# Patient Record
Sex: Female | Born: 1950 | Race: Black or African American | Hispanic: No | Marital: Single | Smoking: Current every day smoker
Health system: Southern US, Community
[De-identification: ages and names within clinical notes are randomized; demographics above are authoritative.]

## PROBLEM LIST (undated history)

## (undated) DIAGNOSIS — M1990 Unspecified osteoarthritis, unspecified site: Secondary | ICD-10-CM

## (undated) DIAGNOSIS — H269 Unspecified cataract: Secondary | ICD-10-CM

## (undated) DIAGNOSIS — E7800 Pure hypercholesterolemia, unspecified: Secondary | ICD-10-CM

## (undated) DIAGNOSIS — I10 Essential (primary) hypertension: Secondary | ICD-10-CM

## (undated) DIAGNOSIS — G629 Polyneuropathy, unspecified: Secondary | ICD-10-CM

## (undated) DIAGNOSIS — G709 Myoneural disorder, unspecified: Secondary | ICD-10-CM

## (undated) HISTORY — DX: Unspecified osteoarthritis, unspecified site: M19.90

## (undated) HISTORY — DX: Myoneural disorder, unspecified: G70.9

## (undated) HISTORY — DX: Polyneuropathy, unspecified: G62.9

## (undated) HISTORY — PX: BREAST CYST EXCISION: SHX579

## (undated) HISTORY — DX: Unspecified cataract: H26.9

## (undated) HISTORY — PX: ABDOMINAL HYSTERECTOMY: SHX81

---

## 2013-04-01 DIAGNOSIS — M159 Polyosteoarthritis, unspecified: Secondary | ICD-10-CM | POA: Insufficient documentation

## 2014-12-26 ENCOUNTER — Emergency Department (HOSPITAL_BASED_OUTPATIENT_CLINIC_OR_DEPARTMENT_OTHER): Payer: BLUE CROSS/BLUE SHIELD

## 2014-12-26 ENCOUNTER — Encounter (HOSPITAL_BASED_OUTPATIENT_CLINIC_OR_DEPARTMENT_OTHER): Payer: Self-pay | Admitting: *Deleted

## 2014-12-26 ENCOUNTER — Emergency Department (HOSPITAL_BASED_OUTPATIENT_CLINIC_OR_DEPARTMENT_OTHER)
Admission: EM | Admit: 2014-12-26 | Discharge: 2014-12-26 | Disposition: A | Discharge status: Home / Self Care | Payer: BLUE CROSS/BLUE SHIELD | Attending: Emergency Medicine | Admitting: Emergency Medicine

## 2014-12-26 DIAGNOSIS — M25551 Pain in right hip: Secondary | ICD-10-CM | POA: Insufficient documentation

## 2014-12-26 DIAGNOSIS — Z720 Tobacco use: Secondary | ICD-10-CM | POA: Insufficient documentation

## 2014-12-26 DIAGNOSIS — Z79899 Other long term (current) drug therapy: Secondary | ICD-10-CM | POA: Diagnosis not present

## 2014-12-26 DIAGNOSIS — I10 Essential (primary) hypertension: Secondary | ICD-10-CM | POA: Diagnosis not present

## 2014-12-26 DIAGNOSIS — E780 Pure hypercholesterolemia: Secondary | ICD-10-CM | POA: Diagnosis not present

## 2014-12-26 DIAGNOSIS — M79604 Pain in right leg: Secondary | ICD-10-CM

## 2014-12-26 HISTORY — DX: Pure hypercholesterolemia, unspecified: E78.00

## 2014-12-26 HISTORY — DX: Essential (primary) hypertension: I10

## 2014-12-26 MED ORDER — HYDROCODONE-ACETAMINOPHEN 5-325 MG PO TABS: 1.0000 | ORAL_TABLET | Freq: Four times a day (QID) | ORAL | Status: DC | PRN

## 2014-12-26 NOTE — ED Notes (Signed)
Pt reports pain in her right hip that goes down her leg for 2 days

## 2014-12-26 NOTE — ED Provider Notes (Signed)
CSN: [id number]     Arrival date & time 12/26/14  1616 History   First MD Initiated Contact with Patient 12/26/14 1622     Chief Complaint  Patient presents with  . Hip Pain     (Consider location/radiation/quality/duration/timing/severity/associated sxs/prior Treatment) HPI  64 year old female presents with right lateral proximal leg pain near her hip for the past 1 week. It seems to come and go. It is worse when she is standing for prolonged times. One time yesterday ran from her leg down to her knee on the anterior aspect of her leg. Otherwise the pain is not radiated all. There is no back pain or abdominal pain. Fevers or chills. No weakness or numbness. No incontinence. Has tried Tylenol without any relief. Denies any injuries.  Past Medical History  Diagnosis Date  . Hypertension   . High cholesterol    Past Surgical History  Procedure Laterality Date  . Abdominal hysterectomy     No family history on file. History  Substance Use Topics  . Smoking status: Current Every Day Smoker  . Smokeless tobacco: Not on file  . Alcohol Use: Yes   OB History    No data available     Review of Systems  Constitutional: Negative for fever.  Gastrointestinal: Negative for abdominal pain.  Musculoskeletal: Positive for arthralgias. Negative for gait problem.  Neurological: Negative for weakness and numbness.  All other systems reviewed and are negative.     Allergies  Review of patient's allergies indicates not on file.  Home Medications   Prior to Admission medications   Medication Sig Start Date End Date Taking? Authorizing Provider  GABAPENTIN PO Take by mouth.   Yes Historical Provider, MD  HYDROCHLOROTHIAZIDE PO Take by mouth.   Yes Historical Provider, MD  SIMVASTATIN PO Take by mouth.   Yes Historical Provider, MD   BP 162/89 mmHg  Pulse 79  Temp(Src) 98.6 F (37 C) (Oral)  Resp 18  Ht 5\' 3"  (1.6 m)  Wt 186 lb (84.369 kg)  BMI 32.96 kg/m2  SpO2 98% Physical  Exam  Constitutional: She is oriented to person, place, and time. She appears well-developed and well-nourished.  HENT:  Head: Normocephalic and atraumatic.  Right Ear: External ear normal.  Left Ear: External ear normal.  Nose: Nose normal.  Eyes: Right eye exhibits no discharge. Left eye exhibits no discharge.  Cardiovascular:  Pulses:      Dorsalis pedis pulses are 2+ on the right side, and 2+ on the left side.  Pulmonary/Chest: Effort normal.  Abdominal: Soft. She exhibits no distension. There is no tenderness.  Musculoskeletal:       Right hip: She exhibits tenderness. She exhibits normal range of motion.       Legs: Neurological: She is alert and oriented to person, place, and time.  Normal strength and sensation in lower extremities.  Skin: Skin is warm and dry.  Nursing note and vitals reviewed.   ED Course  Procedures (including critical care time) Labs Review Labs Reviewed - No data to display  Imaging Review Dg Hip Unilat With Pelvis 1v Right  12/26/2014   CLINICAL DATA:  Right hip pain hernia for 3 days. No known injury. Radiating down the leg.  EXAM: RIGHT HIP (WITH PELVIS) 1 VIEW  COMPARISON:  None.  FINDINGS: No fracture or dislocation. No lytic or sclerotic osseous lesion. Joint spaces are maintained.  There is peripheral vascular atherosclerotic disease.  IMPRESSION: No acute osseous injury of the right hip.  Electronically Signed   By: Kathreen Devoid   On: 12/26/2014 16:56     EKG Interpretation None      MDM   Final diagnoses:  Right leg pain    Patient's pain is likely a sciatica. NV intact, able to ambulate here. Highly doubt spinal emergency. She drove herself here and took tylenol shortly before arrival, thus meds held in ED. Will d/c with Rx for narcotics and recommend f/u with PCP. Discussed strict return precautions.    Ephraim Hamburger, MD 12/26/14 [phone]

## 2015-01-17 ENCOUNTER — Ambulatory Visit: Payer: BLUE CROSS/BLUE SHIELD | Admitting: Family

## 2015-02-01 ENCOUNTER — Encounter: Payer: Self-pay | Admitting: *Deleted

## 2015-02-01 ENCOUNTER — Telehealth: Payer: Self-pay | Admitting: *Deleted

## 2015-02-01 NOTE — Telephone Encounter (Signed)
Pre-Visit Call completed with patient and chart updated.   Pre-Visit Info documented in Specialty Comments under [employer].    

## 2015-02-04 ENCOUNTER — Encounter: Payer: Self-pay | Admitting: Family

## 2015-02-04 ENCOUNTER — Ambulatory Visit (INDEPENDENT_AMBULATORY_CARE_PROVIDER_SITE_OTHER): Payer: BLUE CROSS/BLUE SHIELD | Admitting: Family

## 2015-02-04 ENCOUNTER — Ambulatory Visit (HOSPITAL_BASED_OUTPATIENT_CLINIC_OR_DEPARTMENT_OTHER)
Admission: RE | Admit: 2015-02-04 | Discharge: 2015-02-04 | Disposition: A | Discharge status: Home / Self Care | Payer: BLUE CROSS/BLUE SHIELD | Source: Ambulatory Visit | Attending: Family | Admitting: Family

## 2015-02-04 VITALS — BP 138/84 | HR 67 | Temp 98.3°F | Resp 16 | Ht 62.0 in | Wt 190.4 lb

## 2015-02-04 DIAGNOSIS — R059 Cough, unspecified: Secondary | ICD-10-CM

## 2015-02-04 DIAGNOSIS — G629 Polyneuropathy, unspecified: Secondary | ICD-10-CM | POA: Diagnosis not present

## 2015-02-04 DIAGNOSIS — R05 Cough: Secondary | ICD-10-CM

## 2015-02-04 DIAGNOSIS — E785 Hyperlipidemia, unspecified: Secondary | ICD-10-CM | POA: Diagnosis not present

## 2015-02-04 DIAGNOSIS — I10 Essential (primary) hypertension: Secondary | ICD-10-CM | POA: Insufficient documentation

## 2015-02-04 MED ORDER — FLUTICASONE PROPIONATE 50 MCG/ACT NA SUSP: 2.0000 | Freq: Every day | NASAL | Status: DC

## 2015-02-04 MED ORDER — SIMVASTATIN 40 MG PO TABS: 40.0000 mg | ORAL_TABLET | Freq: Every evening | ORAL | Status: DC

## 2015-02-04 MED ORDER — LORATADINE 10 MG PO TABS: 10.0000 mg | ORAL_TABLET | Freq: Every day | ORAL | Status: DC

## 2015-02-04 MED ORDER — GABAPENTIN 300 MG PO CAPS: 300.0000 mg | ORAL_CAPSULE | Freq: Every evening | ORAL | Status: DC

## 2015-02-04 MED ORDER — HYDROCHLOROTHIAZIDE 25 MG PO TABS: 25.0000 mg | ORAL_TABLET | Freq: Every day | ORAL | Status: DC

## 2015-02-04 NOTE — Assessment & Plan Note (Signed)
Stable on hctz, continue same obtain bmet.

## 2015-02-04 NOTE — Assessment & Plan Note (Signed)
Stable on gabapentin. Continue same.  °

## 2015-02-04 NOTE — Progress Notes (Signed)
Pre visit review using our clinic review tool, if applicable. No additional management support is needed unless otherwise documented below in the visit note. 

## 2015-02-04 NOTE — Assessment & Plan Note (Signed)
Tolerating statin, obtain lipid panel lft.

## 2015-02-04 NOTE — Patient Instructions (Signed)
Please complete lab work prior to leaving. Complete x ray on the first floor. Start claritin 10mg  once daily and flonase 2 sprays each nostril. Call if cough worsens or if not improved in 1 week. Schedule complete physical at the front desk.

## 2015-02-04 NOTE — Progress Notes (Signed)
Subjective:    Patient ID: Janice Meadows, female    DOB: 07/14/1951, 64 y.o.   MRN: [id number]  HPI  Janice Meadows is a 64 yr old female who presents today to establish care.   She presents today with chief complaint of cough. Cough started 8-9 days ago. Reports that she has itching in her throat/ear as  Well. She reports + nasal drainage.  She has not tried any otc antihistamines recently  + sneezing. She reports cough is productive at times of green sputum. Denies fatigue/malaise, denies fever.    HTN- Patient is currently maintained on the following medications for blood pressure: hctz, diagnosed about 20 years ago. Patient reports good compliance with blood pressure medications. Patient denies chest pain, shortness of breath or swelling. Last 3 blood pressure readings in our office are as follows: BP Readings from Last 3 Encounters:  02/04/15 138/84  12/26/14 142/82   Hyperlipidemia-  Patient is currently maintained on the following medication for hyperlipidemia: simvastatin, has been on for years.  Last lipid panel as follows:  Patient denies myalgia. Patient reports good compliance with low fat/low cholesterol diet.   Neuropathy-  Reports that she was diagnosed 3-4 years ago because she was having stinging pain in both legs.  Reports good relief with gabapentin.   Review of Systems  Constitutional: Negative for unexpected weight change.  HENT: Negative for hearing loss.   Eyes: Negative for visual disturbance.  Respiratory: Positive for cough. Negative for shortness of breath.   Cardiovascular: Negative for chest pain.       Occasional dependent edema  Gastrointestinal: Negative for nausea and diarrhea.       Occasional constipation, relieved by laxative  Genitourinary: Negative for dysuria and frequency.  Musculoskeletal: Negative for myalgias, back pain and arthralgias.  Skin: Negative for rash.  Neurological: Negative for headaches.  Hematological: Negative for  adenopathy.  Psychiatric/Behavioral: Negative for dysphoric mood and agitation.   Past Medical History  Diagnosis Date  . Hypertension   . High cholesterol   . Neuropathy     History   Social History  . Marital Status: Single    Spouse Name: N/A  . Number of Children: N/A  . Years of Education: N/A   Occupational History  . Not on file.   Social History Main Topics  . Smoking status: Current Every Day Smoker -- 22 years  . Smokeless tobacco: Never Used     Comment: 6-7 cigarettes daily  . Alcohol Use: 0.0 oz/week    0 Standard drinks or equivalent per week     Comment: occasional  . Drug Use: No  . Sexual Activity: Not on file   Other Topics Concern  . Not on file   Social History Narrative   Works in a [place]   7 children- one deceased shortly after birth   Lives alone but frequently has family visit   Has 7 grandchildren   Enjoys walking, playing grand kids, park   Dog- outdoor dog.         Past Surgical History  Procedure Laterality Date  . Abdominal hysterectomy      Family History  Problem Relation Age of Onset  . Hypertension Mother   . Hypertension Father   . Cancer Sister 93    lymphoma   . Kidney disease Daughter     No Known Allergies  Current Outpatient Prescriptions on File Prior to Visit  Medication Sig Dispense Refill  . gabapentin (NEURONTIN)  300 MG capsule Take 300 mg by mouth every evening.    . hydrochlorothiazide (HYDRODIURIL) 25 MG tablet Take 25 mg by mouth daily.    . simvastatin (ZOCOR) 40 MG tablet Take 40 mg by mouth every evening.     No current facility-administered medications on file prior to visit.    BP 138/84 mmHg  Pulse 67  Temp(Src) 98.3 F (36.8 C) (Oral)  Resp 16  Ht 5\' 2"  (1.575 m)  Wt 190 lb 6.4 oz (86.365 kg)  BMI 34.82 kg/m2  SpO2 97%        Objective:   Physical Exam  Constitutional: She is oriented to person, place, and time. She appears well-developed and well-nourished.  No distress.  HENT:  Head: Normocephalic and atraumatic.  Right Ear: Tympanic membrane and ear canal normal.  Left Ear: Tympanic membrane and ear canal normal.  Mouth/Throat: No oropharyngeal exudate, posterior oropharyngeal edema or posterior oropharyngeal erythema.  Cardiovascular: Normal rate and regular rhythm.   No murmur heard. Pulmonary/Chest: Effort normal and breath sounds normal. No respiratory distress. She has no wheezes. She has no rales. She exhibits no tenderness.  Lymphadenopathy:    She has no cervical adenopathy.  Neurological: She is alert and oriented to person, place, and time.  Skin: Skin is warm and dry.  Psychiatric: She has a normal mood and affect. Her behavior is normal. Judgment and thought content normal.          Assessment & Plan:

## 2015-02-05 DIAGNOSIS — R059 Cough, unspecified: Secondary | ICD-10-CM | POA: Insufficient documentation

## 2015-02-05 DIAGNOSIS — R05 Cough: Secondary | ICD-10-CM | POA: Insufficient documentation

## 2015-02-05 LAB — BASIC METABOLIC PANEL
BUN: 14 mg/dL (ref 6–23)
CO2: 30 mEq/L (ref 19–32)
Calcium: 9.2 mg/dL (ref 8.4–10.5)
Chloride: 104 mEq/L (ref 96–112)
Creatinine, Ser: 0.89 mg/dL (ref 0.40–1.20)
GFR: 82.31 mL/min (ref >60.00)
GLUCOSE: 75 mg/dL (ref 70–99)
POTASSIUM: 3.5 meq/L (ref 3.5–5.1)
Sodium: 138 mEq/L (ref 135–145)

## 2015-02-05 LAB — HEPATIC FUNCTION PANEL
ALT: 13 U/L (ref 0–35)
AST: 25 U/L (ref 0–37)
Albumin: 3.6 g/dL (ref 3.5–5.2)
Alkaline Phosphatase: 86 U/L (ref 39–117)
Bilirubin, Direct: 0.1 mg/dL (ref 0.0–0.3)
TOTAL PROTEIN: 7.3 g/dL (ref 6.0–8.3)
Total Bilirubin: 0.3 mg/dL (ref 0.2–1.2)

## 2015-02-05 LAB — LIPID PANEL
Cholesterol: 174 mg/dL (ref 0–200)
HDL: 70.1 mg/dL (ref >39.00)
LDL CALC: 91 mg/dL (ref 0–99)
NonHDL: 103.9
Total CHOL/HDL Ratio: 2
Triglycerides: 67 mg/dL (ref 0.0–149.0)
VLDL: 13.4 mg/dL (ref 0.0–40.0)

## 2015-02-05 NOTE — Assessment & Plan Note (Signed)
CXR is performed and is clear. Suspect cough secondary to allergies.  Start claritin 10mg  once daily and flonase 2 sprays each nostril. Call if cough worsens or if not improved in 1 week.

## 2015-02-06 ENCOUNTER — Encounter: Payer: Self-pay | Admitting: Family

## 2015-02-12 ENCOUNTER — Telehealth: Payer: Self-pay | Admitting: Family

## 2015-02-12 NOTE — Telephone Encounter (Signed)
Pre Visit letter sent  °

## 2015-03-01 ENCOUNTER — Telehealth: Payer: Self-pay | Admitting: *Deleted

## 2015-03-01 NOTE — Telephone Encounter (Signed)
Unable to reach patient at time of Pre-Visit Call.  Left message for patient to return call when available.    

## 2015-03-04 ENCOUNTER — Encounter: Payer: Self-pay | Admitting: Family

## 2015-03-04 ENCOUNTER — Ambulatory Visit (INDEPENDENT_AMBULATORY_CARE_PROVIDER_SITE_OTHER): Payer: BLUE CROSS/BLUE SHIELD | Admitting: Family

## 2015-03-04 VITALS — BP 140/90 | HR 87 | Temp 97.9°F | Resp 16 | Ht 62.0 in | Wt 185.5 lb

## 2015-03-04 DIAGNOSIS — Z23 Encounter for immunization: Secondary | ICD-10-CM | POA: Diagnosis not present

## 2015-03-04 DIAGNOSIS — Z0000 Encounter for general adult medical examination without abnormal findings: Secondary | ICD-10-CM | POA: Insufficient documentation

## 2015-03-04 DIAGNOSIS — E2839 Other primary ovarian failure: Secondary | ICD-10-CM

## 2015-03-04 LAB — CBC WITH DIFFERENTIAL/PLATELET
BASOS PCT: 0.9 % (ref 0.0–3.0)
Basophils Absolute: 0.1 10*3/uL (ref 0.0–0.1)
Eosinophils Absolute: 0.1 10*3/uL (ref 0.0–0.7)
Eosinophils Relative: 2.3 % (ref 0.0–5.0)
HEMATOCRIT: 42 % (ref 36.0–46.0)
Hemoglobin: 13.8 g/dL (ref 12.0–15.0)
LYMPHS ABS: 2.6 10*3/uL (ref 0.7–4.0)
Lymphocytes Relative: 45.7 % (ref 12.0–46.0)
MCHC: 32.8 g/dL (ref 30.0–36.0)
MCV: 78.7 fl (ref 78.0–100.0)
MONO ABS: 0.4 10*3/uL (ref 0.1–1.0)
MONOS PCT: 7 % (ref 3.0–12.0)
NEUTROS ABS: 2.5 10*3/uL (ref 1.4–7.7)
Neutrophils Relative %: 44.1 % (ref 43.0–77.0)
Platelets: 262 10*3/uL (ref 150.0–400.0)
RBC: 5.34 Mil/uL — ABNORMAL HIGH (ref 3.87–5.11)
RDW: 18.5 % — AB (ref 11.5–15.5)
WBC: 5.6 10*3/uL (ref 4.0–10.5)

## 2015-03-04 LAB — URINALYSIS
BILIRUBIN URINE: NEGATIVE
Ketones, ur: NEGATIVE
Leukocytes, UA: NEGATIVE
NITRITE: NEGATIVE
Specific Gravity, Urine: 1.02 (ref 1.000–1.030)
TOTAL PROTEIN, URINE-UPE24: NEGATIVE
UROBILINOGEN UA: 0.2 (ref 0.0–1.0)
Urine Glucose: NEGATIVE
pH: 6 (ref 5.0–8.0)

## 2015-03-04 LAB — TSH: TSH: 1.31 u[IU]/mL (ref 0.35–4.50)

## 2015-03-04 NOTE — Progress Notes (Signed)
Subjective:    Patient ID: Janice Meadows, female    DOB: 01-Aug-1951, 64 y.o.   MRN: [id number]  HPI   Janice Meadows is a 64 yr old female who presents today for cpx.  Immunizations: due for shingles vaccine and tetanus  Diet: reports diet is healthy, portions too big at times Exercise: reports walking- regular Colonoscopy: due Dexa: due Pap Smear: s/p hysterectomy Mammogram:  due Eye exam- due Dental:  due     Review of Systems  Constitutional: Negative for unexpected weight change.  HENT: Negative for hearing loss and rhinorrhea.   Eyes: Negative for visual disturbance.  Respiratory: Negative for cough.        Cough resolved with claritin  Cardiovascular: Negative for leg swelling.  Gastrointestinal: Negative for nausea and diarrhea.       Notes occasional constipation  Genitourinary: Negative for dysuria and frequency.  Musculoskeletal: Negative for myalgias and arthralgias.  Skin: Negative for rash.  Neurological: Negative for headaches.  Hematological: Negative for adenopathy.  Psychiatric/Behavioral: Negative for dysphoric mood and agitation.       Past Medical History  Diagnosis Date  . Hypertension   . High cholesterol   . Neuropathy     History   Social History  . Marital Status: Single    Spouse Name: N/A  . Number of Children: N/A  . Years of Education: N/A   Occupational History  . Not on file.   Social History Main Topics  . Smoking status: Current Every Day Smoker -- 22 years  . Smokeless tobacco: Never Used     Comment: 6-7 cigarettes daily  . Alcohol Use: 0.0 oz/week    0 Standard drinks or equivalent per week     Comment: occasional  . Drug Use: No  . Sexual Activity: Not on file   Other Topics Concern  . Not on file   Social History Narrative   Works in a [place]   7 children- one deceased shortly after birth   Lives alone but frequently has family visit   Has 7 grandchildren   Enjoys walking, playing grand  kids, park   Dog- outdoor dog.         Past Surgical History  Procedure Laterality Date  . Abdominal hysterectomy      Family History  Problem Relation Age of Onset  . Hypertension Mother   . Hypertension Father   . Cancer Sister 30    lymphoma   . Kidney disease Daughter     No Known Allergies  Current Outpatient Prescriptions on File Prior to Visit  Medication Sig Dispense Refill  . fluticasone (FLONASE) 50 MCG/ACT nasal spray Place 2 sprays into both nostrils daily. 16 g 3  . gabapentin (NEURONTIN) 300 MG capsule Take 1 capsule (300 mg total) by mouth every evening. 90 capsule 1  . hydrochlorothiazide (HYDRODIURIL) 25 MG tablet Take 1 tablet (25 mg total) by mouth daily. 90 tablet 1  . loratadine (CLARITIN) 10 MG tablet Take 1 tablet (10 mg total) by mouth daily. 30 tablet 11  . simvastatin (ZOCOR) 40 MG tablet Take 1 tablet (40 mg total) by mouth every evening. 90 tablet 1   No current facility-administered medications on file prior to visit.    BP 140/90 mmHg  Pulse 87  Temp(Src) 97.9 F (36.6 C) (Oral)  Resp 16  Ht 5\' 2"  (1.575 m)  Wt 185 lb 8 oz (84.142 kg)  BMI 33.92 kg/m2  SpO2 97%  Objective:   Physical Exam  Physical Exam  Constitutional: She is oriented to person, place, and time. She appears well-developed and well-nourished. No distress.  HENT:  Head: Normocephalic and atraumatic.  Right Ear: Tympanic membrane and ear canal normal.  Left Ear: Tympanic membrane and ear canal normal.  Mouth/Throat: Oropharynx is clear and moist.  Eyes: Pupils are equal, round, and reactive to light. No scleral icterus.  Neck: Normal range of motion. No thyromegaly present.  Cardiovascular: Normal rate and regular rhythm.   No murmur heard. Pulmonary/Chest: Effort normal and breath sounds normal. No respiratory distress. He has no wheezes. She has no rales. She exhibits no tenderness.  Abdominal: Soft. Bowel sounds are normal. He exhibits no distension and no  mass. There is no tenderness. There is no rebound and no guarding.  Musculoskeletal: She exhibits no edema.  Lymphadenopathy:    She has no cervical adenopathy.  Neurological: She is alert and oriented to person, place, and time. She has normal right patellar reflex, difficulty eliciting left patellar reflexes. She exhibits normal muscle tone. Coordination normal.  Skin: Skin is warm and dry.  Psychiatric: She has a normal mood and affect. Her behavior is normal. Judgment and thought content normal.  Breasts: Examined lying Right: Without masses, retractions, discharge or axillary adenopathy.  Left: Without masses, retractions, discharge or axillary adenopathy.  Pelvic:  Deferred.            Assessment & Plan:         Assessment & Plan:

## 2015-03-04 NOTE — Patient Instructions (Addendum)
Please complete lab work prior to leaving. Schedule routine eye exam and dental care. Continue regular walking and healthy diet.   You will be contacted about scheduling your colonoscopy, bone density and mammogram. Follow up in 6 months.

## 2015-03-04 NOTE — Progress Notes (Signed)
Pre visit review using our clinic review tool, if applicable. No additional management support is needed unless otherwise documented below in the visit note. 

## 2015-03-04 NOTE — Assessment & Plan Note (Signed)
Discussed healthy diet, exercise, weight loss. EKG today, routine lab work. Tdap today.  Advised pt to check with insurance re: coverage for zostavax and let us know if she would like to proceed.  Refer for mammo, dexa, colo.

## 2015-03-05 ENCOUNTER — Telehealth: Payer: Self-pay | Admitting: *Deleted

## 2015-03-05 DIAGNOSIS — R3129 Other microscopic hematuria: Secondary | ICD-10-CM

## 2015-03-05 NOTE — Telephone Encounter (Signed)
-----   Message from Peggyann Shoals, RMA sent at 03/05/2015  2:46 PM EDT ----- Regarding: lab add-on unable to add micro to urine.

## 2015-03-05 NOTE — Telephone Encounter (Signed)
Please advise 

## 2015-03-06 NOTE — Telephone Encounter (Signed)
Please advise pt that there was possible trace blood in urine. I would like her to repeat UA with micro in 2 weeks, dx microscopic hematuria.

## 2015-03-07 ENCOUNTER — Encounter: Payer: Self-pay | Admitting: Family

## 2015-03-08 NOTE — Telephone Encounter (Signed)
Left detailed message on voicemail re: below result and to call and schedule lab appt in 2 wks. Future lab order entered.

## 2015-03-20 ENCOUNTER — Other Ambulatory Visit (INDEPENDENT_AMBULATORY_CARE_PROVIDER_SITE_OTHER): Payer: BLUE CROSS/BLUE SHIELD

## 2015-03-20 ENCOUNTER — Encounter: Payer: Self-pay | Admitting: Family

## 2015-03-20 DIAGNOSIS — R312 Other microscopic hematuria: Secondary | ICD-10-CM | POA: Diagnosis not present

## 2015-03-20 DIAGNOSIS — R3129 Other microscopic hematuria: Secondary | ICD-10-CM

## 2015-03-20 LAB — URINALYSIS, ROUTINE W REFLEX MICROSCOPIC
Bilirubin Urine: NEGATIVE
KETONES UR: NEGATIVE
Nitrite: NEGATIVE
PH: 6 (ref 5.0–8.0)
Specific Gravity, Urine: 1.02 (ref 1.000–1.030)
TOTAL PROTEIN, URINE-UPE24: NEGATIVE
URINE GLUCOSE: NEGATIVE
UROBILINOGEN UA: 0.2 (ref 0.0–1.0)

## 2015-03-29 ENCOUNTER — Other Ambulatory Visit: Payer: BLUE CROSS/BLUE SHIELD

## 2015-04-12 ENCOUNTER — Ambulatory Visit (HOSPITAL_BASED_OUTPATIENT_CLINIC_OR_DEPARTMENT_OTHER)
Admission: RE | Admit: 2015-04-12 | Discharge: 2015-04-12 | Disposition: A | Discharge status: Home / Self Care | Payer: BLUE CROSS/BLUE SHIELD | Source: Ambulatory Visit | Attending: Family | Admitting: Family

## 2015-04-12 DIAGNOSIS — Z0000 Encounter for general adult medical examination without abnormal findings: Secondary | ICD-10-CM

## 2015-04-12 DIAGNOSIS — Z1231 Encounter for screening mammogram for malignant neoplasm of breast: Secondary | ICD-10-CM | POA: Diagnosis present

## 2015-05-01 ENCOUNTER — Inpatient Hospital Stay (HOSPITAL_BASED_OUTPATIENT_CLINIC_OR_DEPARTMENT_OTHER): Admission: RE | Admit: 2015-05-01 | Payer: BLUE CROSS/BLUE SHIELD | Source: Ambulatory Visit

## 2015-05-01 ENCOUNTER — Encounter: Payer: Self-pay | Admitting: Family

## 2015-05-01 ENCOUNTER — Ambulatory Visit (HOSPITAL_BASED_OUTPATIENT_CLINIC_OR_DEPARTMENT_OTHER)
Admission: RE | Admit: 2015-05-01 | Discharge: 2015-05-01 | Disposition: A | Discharge status: Home / Self Care | Payer: BLUE CROSS/BLUE SHIELD | Source: Ambulatory Visit | Attending: Family | Admitting: Family

## 2015-05-01 DIAGNOSIS — E2839 Other primary ovarian failure: Secondary | ICD-10-CM | POA: Insufficient documentation

## 2015-05-01 DIAGNOSIS — F17210 Nicotine dependence, cigarettes, uncomplicated: Secondary | ICD-10-CM | POA: Insufficient documentation

## 2015-06-11 ENCOUNTER — Encounter (HOSPITAL_BASED_OUTPATIENT_CLINIC_OR_DEPARTMENT_OTHER): Payer: Self-pay | Admitting: *Deleted

## 2015-06-11 ENCOUNTER — Emergency Department (HOSPITAL_BASED_OUTPATIENT_CLINIC_OR_DEPARTMENT_OTHER): Payer: BLUE CROSS/BLUE SHIELD

## 2015-06-11 ENCOUNTER — Emergency Department (HOSPITAL_BASED_OUTPATIENT_CLINIC_OR_DEPARTMENT_OTHER)
Admission: EM | Admit: 2015-06-11 | Discharge: 2015-06-11 | Disposition: A | Discharge status: Home / Self Care | Payer: BLUE CROSS/BLUE SHIELD | Attending: Emergency Medicine | Admitting: Emergency Medicine

## 2015-06-11 ENCOUNTER — Telehealth: Payer: Self-pay | Admitting: Family

## 2015-06-11 DIAGNOSIS — E780 Pure hypercholesterolemia: Secondary | ICD-10-CM | POA: Diagnosis not present

## 2015-06-11 DIAGNOSIS — I10 Essential (primary) hypertension: Secondary | ICD-10-CM | POA: Diagnosis not present

## 2015-06-11 DIAGNOSIS — Z720 Tobacco use: Secondary | ICD-10-CM | POA: Insufficient documentation

## 2015-06-11 DIAGNOSIS — R05 Cough: Secondary | ICD-10-CM

## 2015-06-11 DIAGNOSIS — Z79899 Other long term (current) drug therapy: Secondary | ICD-10-CM | POA: Diagnosis not present

## 2015-06-11 DIAGNOSIS — J9801 Acute bronchospasm: Secondary | ICD-10-CM

## 2015-06-11 DIAGNOSIS — G629 Polyneuropathy, unspecified: Secondary | ICD-10-CM | POA: Insufficient documentation

## 2015-06-11 DIAGNOSIS — R059 Cough, unspecified: Secondary | ICD-10-CM

## 2015-06-11 MED ORDER — ALBUTEROL SULFATE HFA 108 (90 BASE) MCG/ACT IN AERS
2.0000 | INHALATION_SPRAY | Freq: Once | RESPIRATORY_TRACT | Status: AC
  Administered 2015-06-11: 2 via RESPIRATORY_TRACT
  Filled 2015-06-11: qty 6.7

## 2015-06-11 MED ORDER — BENZONATATE 100 MG PO CAPS: 100.0000 mg | ORAL_CAPSULE | Freq: Three times a day (TID) | ORAL | Status: DC | PRN

## 2015-06-11 NOTE — Discharge Instructions (Signed)
USE THE ALBUTEROL INHALER 2 PUFFS EVERY 4 HOURS AS NEEDED FOR SHORTNESS OF BREATH, WHEEZING OR COUGH. YOU MAY HAVE LUNG DISEASE RELATED TO SMOKING, SEE YOUR DOCTOR AS SOON AS POSSIBLE FOR OUTPATIENT LUNG TESTING

## 2015-06-11 NOTE — Telephone Encounter (Signed)
Attempted to reach pt and schedule an appt and pt ended call.

## 2015-06-11 NOTE — ED Provider Notes (Signed)
CSN: [id number]     Arrival date & time 06/11/15  0804 History   First MD Initiated Contact with Patient 06/11/15 [phone]     Chief Complaint  Patient presents with  . Cough     (Consider location/radiation/quality/duration/timing/severity/associated sxs/prior Treatment) HPI  64 year old female presents with a productive cough for about 2 weeks. Patient states the sputum is green in color. Has felt hot at times and has been sweating more over these last couple weeks but denies any fevers. No specific night sweats. Patient denies shortness of breath and no exertional shortness of breath. No chest pain. Patient has occasional rhinorrhea but denies congestion or sore throat. Patient is a daily smoker and has been so for over 20 years. Patient denies any prior history of lung disease. Patient has been trying Mucinex and over-the-counter cough syrup with no relief of her cough and feels like it is getting worse. She has heard wheezing as well. Patient states occasionally at the end of the day she has lower extremity swelling, denies swelling at this time. This has been an ongoing issue for "a while".  Past Medical History  Diagnosis Date  . Hypertension   . High cholesterol   . Neuropathy    Past Surgical History  Procedure Laterality Date  . Abdominal hysterectomy     Family History  Problem Relation Age of Onset  . Hypertension Mother   . Hypertension Father   . Cancer Sister 60    lymphoma   . Kidney disease Daughter    Social History  Substance Use Topics  . Smoking status: Current Every Day Smoker -- 22 years  . Smokeless tobacco: Never Used     Comment: 6-7 cigarettes daily  . Alcohol Use: 0.0 oz/week    0 Standard drinks or equivalent per week     Comment: occasional   OB History    No data available     Review of Systems  Constitutional: Positive for diaphoresis. Negative for fever.  HENT: Positive for rhinorrhea. Negative for congestion and sore throat.   Respiratory:  Positive for cough and wheezing. Negative for shortness of breath.   Cardiovascular: Negative for chest pain.  Gastrointestinal: Negative for vomiting and abdominal pain.  All other systems reviewed and are negative.     Allergies  Review of patient's allergies indicates no known allergies.  Home Medications   Prior to Admission medications   Medication Sig Start Date End Date Taking? Authorizing Provider  gabapentin (NEURONTIN) 300 MG capsule Take 1 capsule (300 mg total) by mouth every evening. 02/04/15   Debbrah Alar, NP  hydrochlorothiazide (HYDRODIURIL) 25 MG tablet Take 1 tablet (25 mg total) by mouth daily. 02/04/15   Debbrah Alar, NP  loratadine (CLARITIN) 10 MG tablet Take 1 tablet (10 mg total) by mouth daily. 02/04/15   Debbrah Alar, NP  simvastatin (ZOCOR) 40 MG tablet Take 1 tablet (40 mg total) by mouth every evening. 02/04/15   Debbrah Alar, NP   BP 160/90 mmHg  Pulse 78  Temp(Src) 98.2 F (36.8 C) (Oral)  Resp 16  Ht 5\' 2"  (1.575 m)  Wt 182 lb (82.555 kg)  BMI 33.28 kg/m2  SpO2 98% Physical Exam  Constitutional: She is oriented to person, place, and time. She appears well-developed and well-nourished.  HENT:  Head: Normocephalic and atraumatic.  Right Ear: External ear normal.  Left Ear: External ear normal.  Nose: Nose normal.  Eyes: Right eye exhibits no discharge. Left eye exhibits no discharge.  Cardiovascular:  Normal rate, regular rhythm and normal heart sounds.   Pulmonary/Chest: Effort normal.  Coarse breath sounds on expiration, some wheezes  Abdominal: Soft. She exhibits no distension. There is no tenderness.  Musculoskeletal: She exhibits no edema or tenderness.  Neurological: She is alert and oriented to person, place, and time.  Skin: Skin is warm and dry.  Nursing note and vitals reviewed.   ED Course  Procedures (including critical care time) Labs Review Labs Reviewed - No data to display  Imaging Review Dg Chest 2  View  06/11/2015   CLINICAL DATA:  History of several months of cough,  EXAM: CHEST  2 VIEW  COMPARISON:  PA and lateral chest of February 04, 2015  FINDINGS: The lungs are adequately inflated. The interstitial markings are mildly prominent though stable. There is no alveolar infiltrate. There is no pleural effusion. The heart is normal in size. The pulmonary vascularity is not engorged. There is stable tortuosity of the descending thoracic aorta. There is moderate multilevel degenerative disc disease of the thoracic spine.  IMPRESSION: There is no acute cardiopulmonary abnormality. Stable mild interstitial prominence may reflect the patient's smoking history.   Electronically Signed   By: David  Martinique M.D.   On: 06/11/2015 08:33   I, Brynleigh Sequeira T, personally reviewed and evaluated these images as part of my medical decision-making.   EKG Interpretation None      MDM   Final diagnoses:  Cough  Acute bronchospasm    Patient's lung sounds have significantly improved and now has clear lungs after albuterol inhaler treatment. I believe that her cough is likely from bronchospasm from either bronchitis or newly developed emphysema/COPD. I discussed this with patient, no indication for acute antibiotics, will discharge with albuterol inhaler as well as antitussives. I have counseled the patient on smoking cessation and recommend follow-up with PCP for outpatient lung testing.    Sherwood Gambler, MD 06/11/15 [phone]

## 2015-06-11 NOTE — ED Notes (Signed)
Pt amb to room 9 with quick steady gait in nad. Moist cough noted during triage, pt reports productive cough x 2 weeks, producing thick green sputum, pt is smoker, denies fevers, nasal congestion or sore throat.

## 2015-06-11 NOTE — Telephone Encounter (Signed)
Please schedule pt for ED follow up with me. OK to set her up after I return from vacation.

## 2015-06-11 NOTE — Telephone Encounter (Signed)
Spoke with pt and she voices understanding.  

## 2015-06-24 ENCOUNTER — Encounter: Payer: Self-pay | Admitting: Family

## 2015-06-24 ENCOUNTER — Ambulatory Visit (INDEPENDENT_AMBULATORY_CARE_PROVIDER_SITE_OTHER): Payer: BLUE CROSS/BLUE SHIELD | Admitting: Family

## 2015-06-24 VITALS — BP 127/82 | HR 77 | Temp 98.1°F | Resp 17 | Wt 186.1 lb

## 2015-06-24 DIAGNOSIS — J209 Acute bronchitis, unspecified: Secondary | ICD-10-CM | POA: Insufficient documentation

## 2015-06-24 DIAGNOSIS — J441 Chronic obstructive pulmonary disease with (acute) exacerbation: Secondary | ICD-10-CM | POA: Diagnosis not present

## 2015-06-24 DIAGNOSIS — J440 Chronic obstructive pulmonary disease with acute lower respiratory infection: Principal | ICD-10-CM

## 2015-06-24 MED ORDER — PREDNISONE 10 MG PO TABS: ORAL_TABLET | ORAL | Status: DC

## 2015-06-24 NOTE — Progress Notes (Signed)
Subjective:    Patient ID: Janice Meadows, female    DOB: [birth year], 64 y.o.   MRN: [id number]  HPI  Janice Meadows is a 64 yr old female who presents today for ED follow up. ED record is reviewed.  She was seen in the ED on 06/11/15. She was treated with albuterol.  She was discharged home with albuterol inhaler + antitussives. Chest x ray was negative for acute abnormality or infiltrate.  Reports that her cough is much better.  Denies fever.    Review of Systems See HPI  Past Medical History  Diagnosis Date  . Hypertension   . High cholesterol   . Neuropathy     Social History   Social History  . Marital Status: Single    Spouse Name: N/A  . Number of Children: N/A  . Years of Education: N/A   Occupational History  . Not on file.   Social History Main Topics  . Smoking status: Current Every Day Smoker -- 22 years  . Smokeless tobacco: Never Used     Comment: 6-7 cigarettes daily  . Alcohol Use: 0.0 oz/week    0 Standard drinks or equivalent per week     Comment: occasional  . Drug Use: No  . Sexual Activity: Not on file   Other Topics Concern  . Not on file   Social History Narrative   Works in a [employer]   7 children- one deceased shortly after birth   Lives alone but frequently has family visit   Has 7 grandchildren   Enjoys walking, playing grand kids, park   Dog- outdoor dog.         Past Surgical History  Procedure Laterality Date  . Abdominal hysterectomy      Family History  Problem Relation Age of Onset  . Hypertension Mother   . Hypertension Father   . Cancer Sister 76    lymphoma   . Kidney disease Daughter     No Known Allergies  Current Outpatient Prescriptions on File Prior to Visit  Medication Sig Dispense Refill  . benzonatate (TESSALON) 100 MG capsule Take 1 capsule (100 mg total) by mouth 3 (three) times daily as needed for cough. 21 capsule 0  . gabapentin (NEURONTIN) 300 MG capsule Take 1 capsule (300 mg total)  by mouth every evening. 90 capsule 1  . hydrochlorothiazide (HYDRODIURIL) 25 MG tablet Take 1 tablet (25 mg total) by mouth daily. 90 tablet 1  . loratadine (CLARITIN) 10 MG tablet Take 1 tablet (10 mg total) by mouth daily. 30 tablet 11  . simvastatin (ZOCOR) 40 MG tablet Take 1 tablet (40 mg total) by mouth every evening. 90 tablet 1   No current facility-administered medications on file prior to visit.    BP 127/82 mmHg  Pulse 77  Temp(Src) 98.1 F (36.7 C) (Oral)  Resp 17  Wt 186 lb 2 oz (84.426 kg)  SpO2 95%       Objective:   Physical Exam  Constitutional: She appears well-developed and well-nourished.  HENT:  Right Ear: Tympanic membrane and ear canal normal.  Left Ear: Tympanic membrane and ear canal normal.  Mouth/Throat: No oropharyngeal exudate or posterior oropharyngeal edema.  Cardiovascular: Normal rate, regular rhythm and normal heart sounds.   No murmur heard. Pulmonary/Chest: Effort normal. No respiratory distress. She has wheezes in the right upper field and the right middle field.  Psychiatric: She has a normal mood and affect. Her behavior is  normal. Judgment and thought content normal.          Assessment & Plan:

## 2015-06-24 NOTE — Assessment & Plan Note (Signed)
Clinically improved, however still tight/wheezing on exam. Will rx with pred taper. Albuterol PRN.

## 2015-06-24 NOTE — Progress Notes (Signed)
Pre visit review using our clinic review tool, if applicable. No additional management support is needed unless otherwise documented below in the visit note. 

## 2015-06-24 NOTE — Patient Instructions (Signed)
Start prednisone taper. Continue albuterol inhaler every 6 hours for the next few days. Call if symptoms worsen, if you develop fever,  or if cough/wheezing not resolved in 1 week.

## 2015-09-06 ENCOUNTER — Ambulatory Visit: Payer: BLUE CROSS/BLUE SHIELD | Admitting: Family

## 2015-09-09 ENCOUNTER — Telehealth: Payer: Self-pay | Admitting: Family

## 2015-09-09 ENCOUNTER — Encounter: Payer: Self-pay | Admitting: Family

## 2015-09-09 ENCOUNTER — Ambulatory Visit (INDEPENDENT_AMBULATORY_CARE_PROVIDER_SITE_OTHER): Payer: BLUE CROSS/BLUE SHIELD | Admitting: Family

## 2015-09-09 VITALS — BP 146/71 | HR 65 | Temp 98.3°F | Resp 16 | Ht 62.0 in | Wt 187.8 lb

## 2015-09-09 DIAGNOSIS — Z114 Encounter for screening for human immunodeficiency virus [HIV]: Secondary | ICD-10-CM

## 2015-09-09 DIAGNOSIS — E785 Hyperlipidemia, unspecified: Secondary | ICD-10-CM

## 2015-09-09 DIAGNOSIS — Z720 Tobacco use: Secondary | ICD-10-CM | POA: Diagnosis not present

## 2015-09-09 DIAGNOSIS — Z23 Encounter for immunization: Secondary | ICD-10-CM

## 2015-09-09 DIAGNOSIS — E876 Hypokalemia: Secondary | ICD-10-CM

## 2015-09-09 DIAGNOSIS — I10 Essential (primary) hypertension: Secondary | ICD-10-CM

## 2015-09-09 DIAGNOSIS — Z1159 Encounter for screening for other viral diseases: Secondary | ICD-10-CM | POA: Diagnosis not present

## 2015-09-09 LAB — BASIC METABOLIC PANEL
BUN: 11 mg/dL (ref 6–23)
CO2: 30 mEq/L (ref 19–32)
CREATININE: 0.83 mg/dL (ref 0.40–1.20)
Calcium: 9.5 mg/dL (ref 8.4–10.5)
Chloride: 105 mEq/L (ref 96–112)
GFR: 89.05 mL/min (ref >60.00)
Glucose, Bld: 96 mg/dL (ref 70–99)
Potassium: 3.3 mEq/L — ABNORMAL LOW (ref 3.5–5.1)
Sodium: 142 mEq/L (ref 135–145)

## 2015-09-09 LAB — HIV ANTIBODY (ROUTINE TESTING W REFLEX): HIV 1&2 Ab, 4th Generation: NONREACTIVE

## 2015-09-09 MED ORDER — SIMVASTATIN 40 MG PO TABS: 40.0000 mg | ORAL_TABLET | Freq: Every evening | ORAL | Status: DC

## 2015-09-09 MED ORDER — POTASSIUM CHLORIDE CRYS ER 20 MEQ PO TBCR: 20.0000 meq | EXTENDED_RELEASE_TABLET | Freq: Every day | ORAL | Status: DC

## 2015-09-09 MED ORDER — HYDROCHLOROTHIAZIDE 25 MG PO TABS: 25.0000 mg | ORAL_TABLET | Freq: Every day | ORAL | Status: DC

## 2015-09-09 MED ORDER — GABAPENTIN 300 MG PO CAPS: 300.0000 mg | ORAL_CAPSULE | Freq: Every evening | ORAL | Status: DC

## 2015-09-09 NOTE — Telephone Encounter (Signed)
K is low. Add kdur 36meq once daily, follow up in 2 weeks for bmet dx hypokalemia.

## 2015-09-09 NOTE — Patient Instructions (Addendum)
Please complete lab work prior to leaving.   

## 2015-09-09 NOTE — Assessment & Plan Note (Signed)
At goal on statin, continue same.

## 2015-09-09 NOTE — Progress Notes (Signed)
Subjective:    Patient ID: Janice Meadows, female    DOB: Mar 04, 1951, 64 y.o.   MRN: [id number]  HPI  Janice Meadows is a 64 yr old female who presents today for follow up.  1) HTN- pt is currently maintained on hctz. She has not yet taken her AM meds.  Otherwise reports good med compliance. Denies CP or sob.   BP Readings from Last 3 Encounters:  09/09/15 146/71  06/24/15 127/82  06/11/15 129/84   2) Hyperlipidemia-  Pt is maintained on simvastatin.  Denies myalgia. Lab Results  Component Value Date   CHOL 174 02/04/2015   HDL 70.10 02/04/2015   LDLCALC 91 02/04/2015   TRIG 67.0 02/04/2015   CHOLHDL 2 02/04/2015   3) Neuropathy- maintained on gabapentin.    4) Tobacco abuse- starting to think about quitting. Smokes 7-8 cigs/day.   Review of Systems See HPI  Past Medical History  Diagnosis Date  . Hypertension   . High cholesterol   . Neuropathy [facility])     Social History   Social History  . Marital Status: Single    Spouse Name: N/A  . Number of Children: N/A  . Years of Education: N/A   Occupational History  . Not on file.   Social History Main Topics  . Smoking status: Current Every Day Smoker -- 22 years  . Smokeless tobacco: Never Used     Comment: 6-7 cigarettes daily  . Alcohol Use: 0.0 oz/week    0 Standard drinks or equivalent per week     Comment: occasional  . Drug Use: No  . Sexual Activity: Not on file   Other Topics Concern  . Not on file   Social History Narrative   Works in a [employer]   7 children- one deceased shortly after birth   Lives alone but frequently has family visit   Has 7 grandchildren   Enjoys walking, playing grand kids, park   Dog- outdoor dog.         Past Surgical History  Procedure Laterality Date  . Abdominal hysterectomy      Family History  Problem Relation Age of Onset  . Hypertension Mother   . Hypertension Father   . Cancer Sister 3    lymphoma   . Kidney disease Daughter     No  Known Allergies  Current Outpatient Prescriptions on File Prior to Visit  Medication Sig Dispense Refill  . gabapentin (NEURONTIN) 300 MG capsule Take 1 capsule (300 mg total) by mouth every evening. 90 capsule 1  . hydrochlorothiazide (HYDRODIURIL) 25 MG tablet Take 1 tablet (25 mg total) by mouth daily. 90 tablet 1  . simvastatin (ZOCOR) 40 MG tablet Take 1 tablet (40 mg total) by mouth every evening. 90 tablet 1   No current facility-administered medications on file prior to visit.    BP 146/71 mmHg  Pulse 65  Temp(Src) 98.3 F (36.8 C) (Oral)  Resp 16  Ht 5\' 2"  (1.575 m)  Wt 187 lb 12.8 oz (85.186 kg)  BMI 34.34 kg/m2  SpO2 99%       Objective:   Physical Exam  Constitutional: She is oriented to person, place, and time. She appears well-developed and well-nourished.  Cardiovascular: Normal rate, regular rhythm and normal heart sounds.   No murmur heard. Pulmonary/Chest: Effort normal and breath sounds normal. No respiratory distress. She has no wheezes.  Musculoskeletal: She exhibits no edema.  Neurological: She is alert and oriented to  person, place, and time.  Psychiatric: She has a normal mood and affect. Her behavior is normal. Judgment and thought content normal.          Assessment & Plan:  Flu shot today

## 2015-09-09 NOTE — Assessment & Plan Note (Signed)
She is contemplating quitting smoking. We discussed chantix, patch, zyban.  She will consider. 5 min spent on tobacco cessation counseling.

## 2015-09-09 NOTE — Assessment & Plan Note (Signed)
BP up a bit this AM, however she did not take AM meds. Continue hctz at current dose.

## 2015-09-09 NOTE — Progress Notes (Signed)
Pre visit review using our clinic review tool, if applicable. No additional management support is needed unless otherwise documented below in the visit note. 

## 2015-09-09 NOTE — Assessment & Plan Note (Signed)
Controlled with gabapentin, continue same.

## 2015-09-10 ENCOUNTER — Encounter: Payer: Self-pay | Admitting: Family

## 2015-09-10 LAB — HEPATITIS C ANTIBODY: HCV Ab: NEGATIVE

## 2015-09-10 NOTE — Telephone Encounter (Signed)
Left detailed message on note below for pt to call back if she has any questions. Future order placed.

## 2015-09-23 ENCOUNTER — Encounter: Payer: Self-pay | Admitting: Family

## 2015-09-23 ENCOUNTER — Ambulatory Visit (INDEPENDENT_AMBULATORY_CARE_PROVIDER_SITE_OTHER): Payer: BLUE CROSS/BLUE SHIELD | Admitting: Family

## 2015-09-23 VITALS — BP 142/81 | HR 78 | Temp 98.1°F | Resp 16 | Ht 62.0 in | Wt 185.0 lb

## 2015-09-23 DIAGNOSIS — E876 Hypokalemia: Secondary | ICD-10-CM | POA: Diagnosis not present

## 2015-09-23 DIAGNOSIS — M545 Low back pain, unspecified: Secondary | ICD-10-CM | POA: Insufficient documentation

## 2015-09-23 DIAGNOSIS — I10 Essential (primary) hypertension: Secondary | ICD-10-CM

## 2015-09-23 LAB — BASIC METABOLIC PANEL
BUN: 18 mg/dL (ref 6–23)
CALCIUM: 9.4 mg/dL (ref 8.4–10.5)
CO2: 27 mEq/L (ref 19–32)
CREATININE: 0.96 mg/dL (ref 0.40–1.20)
Chloride: 104 mEq/L (ref 96–112)
GFR: 75.27 mL/min (ref >60.00)
GLUCOSE: 88 mg/dL (ref 70–99)
POTASSIUM: 3.7 meq/L (ref 3.5–5.1)
Sodium: 140 mEq/L (ref 135–145)

## 2015-09-23 MED ORDER — MELOXICAM 7.5 MG PO TABS: 7.5000 mg | ORAL_TABLET | Freq: Every day | ORAL | Status: DC

## 2015-09-23 NOTE — Assessment & Plan Note (Signed)
Fair BP. Continue HCTZ, obtain follow up bmet.

## 2015-09-23 NOTE — Patient Instructions (Signed)
Please complete lab work prior to leaving. Start meloxicam one tablet once daily as needed for back pain. When this runs out, please switch to tylenol. Call if back pain worsens or does not improve

## 2015-09-23 NOTE — Assessment & Plan Note (Signed)
New.  Trial of meloxicam. Consider further imaging or PT referral if symptoms worsen or do not improve.

## 2015-09-23 NOTE — Progress Notes (Signed)
Pre visit review using our clinic review tool, if applicable. No additional management support is needed unless otherwise documented below in the visit note. 

## 2015-09-23 NOTE — Progress Notes (Signed)
Subjective:     Patient ID: Janice Meadows, female   DOB: 04-May-1951, 64 y.o.   MRN: [id number]  HPI  Janice Meadows is a 64 yr old female who presents today for follow up. She was seen last week and had not taken her BP med.  BP was mildly elevated.  She has not taken med yet this AM. Denies CP/SOB or swelling.  BP Readings from Last 3 Encounters:  09/23/15 142/81  09/09/15 146/71  06/24/15 127/82   Low back pain- pt reports low back pain, radiates into the buttocks, hurts especially when she tries to get out of a chair. Pain does not radiate into the legs. Using tylenol without improvement.  Review of Systems See HPI  Past Medical History  Diagnosis Date  . Hypertension   . High cholesterol   . Neuropathy [facility])     Social History   Social History  . Marital Status: Single    Spouse Name: N/A  . Number of Children: N/A  . Years of Education: N/A   Occupational History  . Not on file.   Social History Main Topics  . Smoking status: Current Every Day Smoker -- 22 years  . Smokeless tobacco: Never Used     Comment: 6-7 cigarettes daily  . Alcohol Use: 0.0 oz/week    0 Standard drinks or equivalent per week     Comment: occasional  . Drug Use: No  . Sexual Activity: Not on file   Other Topics Concern  . Not on file   Social History Narrative   Works in a [employer]   7 children- one deceased shortly after birth   Lives alone but frequently has family visit   Has 7 grandchildren   Enjoys walking, playing grand kids, park   Dog- outdoor dog.         Past Surgical History  Procedure Laterality Date  . Abdominal hysterectomy      Family History  Problem Relation Age of Onset  . Hypertension Mother   . Hypertension Father   . Cancer Sister 76    lymphoma   . Kidney disease Daughter     No Known Allergies  Current Outpatient Prescriptions on File Prior to Visit  Medication Sig Dispense Refill  . gabapentin (NEURONTIN) 300 MG capsule Take 1  capsule (300 mg total) by mouth every evening. 90 capsule 1  . hydrochlorothiazide (HYDRODIURIL) 25 MG tablet Take 1 tablet (25 mg total) by mouth daily. 90 tablet 1  . potassium chloride SA (K-DUR,KLOR-CON) 20 MEQ tablet Take 1 tablet (20 mEq total) by mouth daily. 30 tablet 3  . simvastatin (ZOCOR) 40 MG tablet Take 1 tablet (40 mg total) by mouth every evening. 90 tablet 1   No current facility-administered medications on file prior to visit.    BP 142/81 mmHg  Pulse 78  Temp(Src) 98.1 F (36.7 C) (Oral)  Resp 16  Ht 5\' 2"  (1.575 m)  Wt 185 lb (83.915 kg)  BMI 33.83 kg/m2  SpO2 100%       Objective:   Physical Exam  Constitutional: She appears well-developed and well-nourished.  Eyes: No scleral icterus.  Cardiovascular: Normal rate, regular rhythm and normal heart sounds.   No murmur heard. Pulmonary/Chest: Effort normal and breath sounds normal. No respiratory distress. She has no wheezes.  Musculoskeletal: She exhibits no edema.  Skin: Skin is warm and dry.  Psychiatric: She has a normal mood and affect. Her behavior is normal. Judgment  and thought content normal.       Assessment:         Plan:

## 2015-12-18 ENCOUNTER — Ambulatory Visit (INDEPENDENT_AMBULATORY_CARE_PROVIDER_SITE_OTHER): Payer: BLUE CROSS/BLUE SHIELD | Admitting: Family Medicine

## 2015-12-18 ENCOUNTER — Ambulatory Visit (HOSPITAL_BASED_OUTPATIENT_CLINIC_OR_DEPARTMENT_OTHER)
Admission: RE | Admit: 2015-12-18 | Discharge: 2015-12-18 | Disposition: A | Discharge status: Home / Self Care | Payer: BLUE CROSS/BLUE SHIELD | Source: Ambulatory Visit | Attending: Family Medicine | Admitting: Family Medicine

## 2015-12-18 ENCOUNTER — Encounter: Payer: Self-pay | Admitting: Family Medicine

## 2015-12-18 VITALS — BP 147/90 | HR 92 | Temp 100.5°F | Ht 62.0 in | Wt 178.8 lb

## 2015-12-18 DIAGNOSIS — R509 Fever, unspecified: Secondary | ICD-10-CM | POA: Insufficient documentation

## 2015-12-18 DIAGNOSIS — R062 Wheezing: Secondary | ICD-10-CM | POA: Diagnosis not present

## 2015-12-18 DIAGNOSIS — R5381 Other malaise: Secondary | ICD-10-CM

## 2015-12-18 DIAGNOSIS — R05 Cough: Secondary | ICD-10-CM | POA: Insufficient documentation

## 2015-12-18 DIAGNOSIS — R059 Cough, unspecified: Secondary | ICD-10-CM

## 2015-12-18 LAB — POCT INFLUENZA A/B
Influenza A, POC: NEGATIVE
Influenza B, POC: NEGATIVE

## 2015-12-18 MED ORDER — ALBUTEROL SULFATE HFA 108 (90 BASE) MCG/ACT IN AERS: 2.0000 | INHALATION_SPRAY | Freq: Four times a day (QID) | RESPIRATORY_TRACT | Status: DC | PRN

## 2015-12-18 MED ORDER — BENZONATATE 100 MG PO CAPS: 100.0000 mg | ORAL_CAPSULE | Freq: Three times a day (TID) | ORAL | Status: DC | PRN

## 2015-12-18 NOTE — Progress Notes (Signed)
[place] [facility] [address], [place], [place] [address] [phone]  Date:  12/18/2015   Name:  Janice Meadows   DOB:  1951/10/26   MRN:  [id number]  PCP:  Nance Pear., NP    Chief Complaint: Cough   History of Present Illness:  Janice Meadows is a 65 y.o. very pleasant female patient who presents with the following:  Today is Wednesday- she has been sick since Sunday with flu like sx. She has noted chills, sneezing, headache, body aches, cough, ST.  She did vomit once on Monday am.  No GI symptoms since She is not sure about any fever at home No sick contacts at home She has tried some OTC cold preps but they have not helped her much so far  She is a smoker No meds used yet today   Patient Active Problem List   Diagnosis Date Noted  . Low back pain 09/23/2015  . Tobacco abuse 09/09/2015  . Preventative health care 03/04/2015  . HTN (hypertension) 02/04/2015  . Hyperlipidemia 02/04/2015  . Neuropathy ([place]) 02/04/2015  . Generalized OA 04/01/2013    Past Medical History  Diagnosis Date  . Hypertension   . High cholesterol   . Neuropathy [facility])     Past Surgical History  Procedure Laterality Date  . Abdominal hysterectomy      Social History  Substance Use Topics  . Smoking status: Current Every Day Smoker -- 22 years  . Smokeless tobacco: Never Used     Comment: 6-7 cigarettes daily  . Alcohol Use: 0.0 oz/week    0 Standard drinks or equivalent per week     Comment: occasional    Family History  Problem Relation Age of Onset  . Hypertension Mother   . Hypertension Father   . Cancer Sister 64    lymphoma   . Kidney disease Daughter     No Known Allergies  Medication list has been reviewed and updated.  Current Outpatient Prescriptions on File Prior to Visit  Medication Sig Dispense Refill  . gabapentin (NEURONTIN) 300 MG capsule Take 1 capsule (300 mg total) by mouth every evening.  90 capsule 1  . hydrochlorothiazide (HYDRODIURIL) 25 MG tablet Take 1 tablet (25 mg total) by mouth daily. 90 tablet 1  . potassium chloride SA (K-DUR,KLOR-CON) 20 MEQ tablet Take 1 tablet (20 mEq total) by mouth daily. 30 tablet 3  . simvastatin (ZOCOR) 40 MG tablet Take 1 tablet (40 mg total) by mouth every evening. 90 tablet 1   No current facility-administered medications on file prior to visit.    Review of Systems:  As per HPI- otherwise negative.   Physical Examination: Filed Vitals:   12/18/15 1145  BP: 147/90  Pulse: 92  Temp: 100.5 F (38.1 C)   Filed Vitals:   12/18/15 1145  Height: 5\' 2"  (1.575 m)  Weight: 178 lb 12.8 oz (81.103 kg)   Body mass index is 32.69 kg/(m^2). Ideal Body Weight: Weight in (lb) to have BMI = 25: 136.4  GEN: WDWN, NAD, Non-toxic, A & O x 3, looks like she has the flu HEENT: Atraumatic, Normocephalic. Neck supple. No masses, No LAD.  Bilateral TM wnl, oropharynx normal.  PEERL,EOMI.   Ears and Nose: No external deformity. CV: RRR, No M/G/R. No JVD. No thrill. No extra heart sounds. PULM: mild expiratory wheezing and ronchi.  No retractions. No resp. distress. No accessory muscle use. EXTR: No c/c/e  NEURO Normal gait.  PSYCH: Normally interactive. Conversant. Not depressed or anxious appearing.  Calm demeanor.   Dg Chest 2 View  12/18/2015  CLINICAL DATA:  Cough and fever for 2 days.  Initial encounter. EXAM: CHEST  2 VIEW COMPARISON:  PA and lateral chest 06/11/2015. FINDINGS: The lungs are clear. Heart size is upper normal. No pneumothorax or pleural effusion. No focal bony abnormality. IMPRESSION: No acute disease. Electronically Signed   By: Inge Rise M.D.   On: 12/18/2015 12:37   POC Rapid flu negative toda Assessment and Plan: Cough - Plan: POCT Influenza A/B, DG Chest 2 View, benzonatate (TESSALON) 100 MG capsule  Malaise - Plan: POCT Influenza A/B, DG Chest 2 View  Low grade fever - Plan: POCT Influenza A/B, DG Chest 2  View  Wheezing - Plan: albuterol (PROVENTIL HFA;VENTOLIN HFA) 108 (90 Base) MCG/ACT inhaler  Flu test negative but she likely does have flu.  Had her go for a CXR- this is also negative.  Will treat supportively for presumed flu Albuterol and tessalon prn  Signed Lamar Blinks, MD

## 2015-12-18 NOTE — Patient Instructions (Addendum)
I suspect that you do have the flu although your test was negative today Rest, drink plenty of fluids and use the inhaler as needed for any wheezing Ibuprofen and/ or tylenol will help with aches and fever You can also continue to use over the counter medications as needed We will have you get a chest x-ray today to make sure you do not have pneumonia.  After your x-ray downstairs you can go home and I will give you a call with the result later on today  Let me know if you are getting worse or if you are not better soon!

## 2016-03-09 ENCOUNTER — Ambulatory Visit (INDEPENDENT_AMBULATORY_CARE_PROVIDER_SITE_OTHER): Payer: BLUE CROSS/BLUE SHIELD | Admitting: Family

## 2016-03-09 ENCOUNTER — Encounter: Payer: Self-pay | Admitting: Family

## 2016-03-09 VITALS — BP 126/80 | HR 68 | Temp 98.3°F | Resp 16 | Ht 62.0 in | Wt 179.8 lb

## 2016-03-09 DIAGNOSIS — G629 Polyneuropathy, unspecified: Secondary | ICD-10-CM

## 2016-03-09 DIAGNOSIS — E785 Hyperlipidemia, unspecified: Secondary | ICD-10-CM | POA: Diagnosis not present

## 2016-03-09 DIAGNOSIS — I10 Essential (primary) hypertension: Secondary | ICD-10-CM

## 2016-03-09 LAB — LIPID PANEL
CHOL/HDL RATIO: 3
Cholesterol: 172 mg/dL (ref 0–200)
HDL: 63.8 mg/dL (ref >39.00)
LDL Cholesterol: 99 mg/dL (ref 0–99)
NONHDL: 108.28
Triglycerides: 44 mg/dL (ref 0.0–149.0)
VLDL: 8.8 mg/dL (ref 0.0–40.0)

## 2016-03-09 LAB — BASIC METABOLIC PANEL
BUN: 17 mg/dL (ref 6–23)
CALCIUM: 9.2 mg/dL (ref 8.4–10.5)
CHLORIDE: 105 meq/L (ref 96–112)
CO2: 29 mEq/L (ref 19–32)
CREATININE: 0.78 mg/dL (ref 0.40–1.20)
GFR: 95.52 mL/min (ref >60.00)
Glucose, Bld: 102 mg/dL — ABNORMAL HIGH (ref 70–99)
Potassium: 3.1 mEq/L — ABNORMAL LOW (ref 3.5–5.1)
SODIUM: 142 meq/L (ref 135–145)

## 2016-03-09 MED ORDER — POTASSIUM CHLORIDE CRYS ER 20 MEQ PO TBCR: 20.0000 meq | EXTENDED_RELEASE_TABLET | Freq: Every day | ORAL | Status: DC

## 2016-03-09 MED ORDER — HYDROCHLOROTHIAZIDE 25 MG PO TABS: 25.0000 mg | ORAL_TABLET | Freq: Every day | ORAL | Status: DC

## 2016-03-09 MED ORDER — SIMVASTATIN 40 MG PO TABS: 40.0000 mg | ORAL_TABLET | Freq: Every evening | ORAL | Status: DC

## 2016-03-09 MED ORDER — GABAPENTIN 300 MG PO CAPS: 300.0000 mg | ORAL_CAPSULE | Freq: Every evening | ORAL | Status: DC

## 2016-03-09 NOTE — Assessment & Plan Note (Signed)
Stable on hctz, continue same. Obtain follow up bmet to assess renal fxn and electrolytes.

## 2016-03-09 NOTE — Progress Notes (Signed)
Subjective:    Patient ID: Janice Meadows, female    DOB: [birth year], 65 y.o.   MRN: [id number]  HPI  Ms. Prusak is a 65 yr old female who presents today for follow up.  1) Hyperlipidemia-  Pt is maintained on simvastatin.  Lab Results  Component Value Date   CHOL 174 02/04/2015   HDL 70.10 02/04/2015   LDLCALC 91 02/04/2015   TRIG 67.0 02/04/2015   CHOLHDL 2 02/04/2015   2) HTN- continues hctz.  BP Readings from Last 3 Encounters:  03/09/16 126/80  12/18/15 147/90  09/23/15 142/81   3) Neuropathy-  Was given rx for  Gabapentin- this has not been refilled in several months. She reports that she is only taking gabapentin once daily. Notes some neuropathy symptoms which are worst at night.     Review of Systems  Respiratory: Negative for shortness of breath.   Cardiovascular: Negative for chest pain and leg swelling.  Musculoskeletal: Negative for myalgias.   Past Medical History  Diagnosis Date  . Hypertension   . High cholesterol   . Neuropathy [facility])      Social History   Social History  . Marital Status: Single    Spouse Name: N/A  . Number of Children: N/A  . Years of Education: N/A   Occupational History  . Not on file.   Social History Main Topics  . Smoking status: Current Every Day Smoker -- 22 years  . Smokeless tobacco: Never Used     Comment: 6-7 cigarettes daily  . Alcohol Use: 0.0 oz/week    0 Standard drinks or equivalent per week     Comment: occasional  . Drug Use: No  . Sexual Activity: Not on file   Other Topics Concern  . Not on file   Social History Narrative   Works in a [employer]   7 children- one deceased shortly after birth   Lives alone but frequently has family visit   Has 7 grandchildren   Enjoys walking, playing grand kids, park   Dog- outdoor dog.         Past Surgical History  Procedure Laterality Date  . Abdominal hysterectomy      Family History  Problem Relation Age of Onset  . Hypertension  Mother   . Hypertension Father   . Cancer Sister 12    lymphoma   . Kidney disease Daughter     No Known Allergies  Current Outpatient Prescriptions on File Prior to Visit  Medication Sig Dispense Refill  . gabapentin (NEURONTIN) 300 MG capsule Take 1 capsule (300 mg total) by mouth every evening. 90 capsule 1  . hydrochlorothiazide (HYDRODIURIL) 25 MG tablet Take 1 tablet (25 mg total) by mouth daily. 90 tablet 1  . potassium chloride SA (K-DUR,KLOR-CON) 20 MEQ tablet Take 1 tablet (20 mEq total) by mouth daily. 30 tablet 3  . simvastatin (ZOCOR) 40 MG tablet Take 1 tablet (40 mg total) by mouth every evening. 90 tablet 1   No current facility-administered medications on file prior to visit.    BP 126/80 mmHg  Pulse 68  Temp(Src) 98.3 F (36.8 C) (Oral)  Resp 16  Ht 5\' 2"  (1.575 m)  Wt 179 lb 12.8 oz (81.557 kg)  BMI 32.88 kg/m2  SpO2 98%       Objective:   Physical Exam  Constitutional: She is oriented to person, place, and time. She appears well-developed and well-nourished.  HENT:  Head: Normocephalic and  atraumatic.  Cardiovascular: Normal rate, regular rhythm and normal heart sounds.   No murmur heard. Pulmonary/Chest: Effort normal and breath sounds normal. No respiratory distress. She has no wheezes.  Neurological: She is alert and oriented to person, place, and time.  Skin: Skin is warm and dry.  Psychiatric: She has a normal mood and affect. Her behavior is normal. Judgment and thought content normal.          Assessment & Plan:

## 2016-03-09 NOTE — Assessment & Plan Note (Signed)
Tolerating statin. Continue same, obtain lipid panel.

## 2016-03-09 NOTE — Patient Instructions (Addendum)
Please complete lab work prior to leaving. Increase gabapentin to 3 times a day.

## 2016-03-09 NOTE — Assessment & Plan Note (Signed)
Advised pt to increase gabapentin to TID.

## 2016-03-09 NOTE — Progress Notes (Signed)
Pre visit review using our clinic review tool, if applicable. No additional management support is needed unless otherwise documented below in the visit note. 

## 2016-03-12 ENCOUNTER — Telehealth: Payer: Self-pay | Admitting: Family

## 2016-03-12 NOTE — Telephone Encounter (Signed)
Please confirm pt is taking Kdur daily. If not, increase to 70meq PO BID and repeat bmet in 1 week. Cholesterol looks good.

## 2016-03-13 NOTE — Telephone Encounter (Signed)
Notified pt. She states she had run out of Potassium for 3 days prior to last appt. She will continue daily potassium supplement.

## 2016-03-13 NOTE — Telephone Encounter (Signed)
Left message for pt to return my call.

## 2016-07-31 ENCOUNTER — Telehealth: Payer: Self-pay | Admitting: *Deleted

## 2016-07-31 ENCOUNTER — Ambulatory Visit (INDEPENDENT_AMBULATORY_CARE_PROVIDER_SITE_OTHER): Payer: BLUE CROSS/BLUE SHIELD | Admitting: Family

## 2016-07-31 ENCOUNTER — Encounter: Payer: Self-pay | Admitting: Family

## 2016-07-31 VITALS — BP 133/69 | HR 60 | Temp 98.2°F | Resp 16 | Ht 62.0 in | Wt 175.0 lb

## 2016-07-31 DIAGNOSIS — R9431 Abnormal electrocardiogram [ECG] [EKG]: Secondary | ICD-10-CM

## 2016-07-31 DIAGNOSIS — Z23 Encounter for immunization: Secondary | ICD-10-CM | POA: Diagnosis not present

## 2016-07-31 DIAGNOSIS — Z0000 Encounter for general adult medical examination without abnormal findings: Secondary | ICD-10-CM | POA: Diagnosis not present

## 2016-07-31 LAB — CBC WITH DIFFERENTIAL/PLATELET
BASOS ABS: 0 10*3/uL (ref 0.0–0.1)
Basophils Relative: 0.6 % (ref 0.0–3.0)
EOS ABS: 0.2 10*3/uL (ref 0.0–0.7)
EOS PCT: 3.6 % (ref 0.0–5.0)
HCT: 40.6 % (ref 36.0–46.0)
HEMOGLOBIN: 13.3 g/dL (ref 12.0–15.0)
Lymphocytes Relative: 46.3 % — ABNORMAL HIGH (ref 12.0–46.0)
Lymphs Abs: 2.9 10*3/uL (ref 0.7–4.0)
MCHC: 32.8 g/dL (ref 30.0–36.0)
MCV: 78.5 fl (ref 78.0–100.0)
MONO ABS: 0.5 10*3/uL (ref 0.1–1.0)
Monocytes Relative: 8.1 % (ref 3.0–12.0)
Neutro Abs: 2.6 10*3/uL (ref 1.4–7.7)
Neutrophils Relative %: 41.4 % — ABNORMAL LOW (ref 43.0–77.0)
Platelets: 279 10*3/uL (ref 150.0–400.0)
RBC: 5.17 Mil/uL — AB (ref 3.87–5.11)
RDW: 18.5 % — ABNORMAL HIGH (ref 11.5–15.5)
WBC: 6.3 10*3/uL (ref 4.0–10.5)

## 2016-07-31 LAB — HEPATIC FUNCTION PANEL
ALK PHOS: 81 U/L (ref 39–117)
ALT: 14 U/L (ref 0–35)
AST: 24 U/L (ref 0–37)
Albumin: 3.6 g/dL (ref 3.5–5.2)
BILIRUBIN DIRECT: 0 mg/dL (ref 0.0–0.3)
BILIRUBIN TOTAL: 0.4 mg/dL (ref 0.2–1.2)
Total Protein: 7.3 g/dL (ref 6.0–8.3)

## 2016-07-31 LAB — URINALYSIS, ROUTINE W REFLEX MICROSCOPIC
Bilirubin Urine: NEGATIVE
HGB URINE DIPSTICK: NEGATIVE
Ketones, ur: NEGATIVE
Leukocytes, UA: NEGATIVE
Nitrite: NEGATIVE
PH: 5.5 (ref 5.0–8.0)
Specific Gravity, Urine: 1.025 (ref 1.000–1.030)
TOTAL PROTEIN, URINE-UPE24: NEGATIVE
Urine Glucose: NEGATIVE
Urobilinogen, UA: 0.2 (ref 0.0–1.0)

## 2016-07-31 LAB — LIPID PANEL
CHOL/HDL RATIO: 2
Cholesterol: 176 mg/dL (ref 0–200)
HDL: 76.2 mg/dL (ref >39.00)
LDL CALC: 91 mg/dL (ref 0–99)
NonHDL: 99.45
Triglycerides: 41 mg/dL (ref 0.0–149.0)
VLDL: 8.2 mg/dL (ref 0.0–40.0)

## 2016-07-31 LAB — BASIC METABOLIC PANEL
BUN: 17 mg/dL (ref 6–23)
CALCIUM: 9.4 mg/dL (ref 8.4–10.5)
CO2: 29 mEq/L (ref 19–32)
CREATININE: 0.94 mg/dL (ref 0.40–1.20)
Chloride: 106 mEq/L (ref 96–112)
GFR: 76.92 mL/min (ref >60.00)
Glucose, Bld: 95 mg/dL (ref 70–99)
POTASSIUM: 3.7 meq/L (ref 3.5–5.1)
Sodium: 141 mEq/L (ref 135–145)

## 2016-07-31 LAB — TSH: TSH: 0.8 u[IU]/mL (ref 0.35–4.50)

## 2016-07-31 NOTE — Telephone Encounter (Signed)
Kidney function, electrolytes, liver testing, blood count, thyroid function all look good.

## 2016-07-31 NOTE — Patient Instructions (Addendum)
Please check coverage with [employer] re:  Coverage for shingles vaccine (Zostavax) and then book a nurse visit for the shot before you go on medicare. Complete lab work prior to leaving.  You will be contacted about your referral to cardiology.

## 2016-07-31 NOTE — Telephone Encounter (Signed)
Received call from the lab stating they just made note to pt's urinalysis of budding yeast and wanted to make sure PCP was aware. Please advise.

## 2016-07-31 NOTE — Progress Notes (Signed)
Subjective:    Patient ID: Janice Meadows, female    DOB: [birth year], 64 y.o.   MRN: [id number]  HPI  Patient presents today for complete physical.  Immunizations:  Flu shot today, tetanus up to date.  Diet: healthy Exercise: walks Colonoscopy: never had colonoscopy Dexa: 2019 Pap Smear:  hysterectomy Mammogram:  due  Wt Readings from Last 3 Encounters:  07/31/16 175 lb (79.4 kg)  03/09/16 179 lb 12.8 oz (81.6 kg)  12/18/15 178 lb 12.8 oz (81.1 kg)   Tobacco abuse 6-7 cig a day. Not motivated to quit  Review of Systems  Constitutional: Negative for unexpected weight change.  HENT: Negative for hearing loss.   Eyes: Negative for visual disturbance.  Respiratory: Negative for shortness of breath.        Reports mild cough due to allergies  Cardiovascular: Negative for chest pain and leg swelling.  Gastrointestinal: Negative for diarrhea.       Occasional constipation  Genitourinary: Negative for dysuria and frequency.  Musculoskeletal:       Some pain in hip and hands- relieved with tylenol  Skin: Negative for rash.  Neurological: Negative for headaches.  Hematological: Negative for adenopathy.  Psychiatric/Behavioral:       Denies depression/anxiety   Past Medical History:  Diagnosis Date  . High cholesterol   . Hypertension   . Neuropathy [facility])      Social History   Social History  . Marital status: Single    Spouse name: N/A  . Number of children: N/A  . Years of education: N/A   Occupational History  . Not on file.   Social History Main Topics  . Smoking status: Current Every Day Smoker    Years: 22.00  . Smokeless tobacco: Never Used     Comment: 6-7 cigarettes daily  . Alcohol use 0.0 oz/week     Comment: occasional  . Drug use: No  . Sexual activity: Not on file   Other Topics Concern  . Not on file   Social History Narrative   Works in a [employer]   7 children- one deceased shortly after birth   Lives alone but frequently  has family visit   Has 7 grandchildren   Enjoys walking, playing grand kids, park   Dog- outdoor dog.         Past Surgical History:  Procedure Laterality Date  . ABDOMINAL HYSTERECTOMY      Family History  Problem Relation Age of Onset  . Hypertension Mother   . Hypertension Father   . Cancer Sister 56    lymphoma   . Kidney disease Daughter     No Known Allergies  Current Outpatient Prescriptions on File Prior to Visit  Medication Sig Dispense Refill  . gabapentin (NEURONTIN) 300 MG capsule Take 1 capsule (300 mg total) by mouth every evening. 90 capsule 1  . hydrochlorothiazide (HYDRODIURIL) 25 MG tablet Take 1 tablet (25 mg total) by mouth daily. 90 tablet 1  . potassium chloride SA (K-DUR,KLOR-CON) 20 MEQ tablet Take 1 tablet (20 mEq total) by mouth daily. 90 tablet 1  . simvastatin (ZOCOR) 40 MG tablet Take 1 tablet (40 mg total) by mouth every evening. 90 tablet 1   No current facility-administered medications on file prior to visit.     BP 133/69 (BP Location: Right Arm, Cuff Size: Normal)   Pulse 60   Temp 98.2 F (36.8 C) (Oral)   Resp 16   Ht 5\' 2"  (  1.575 m)   Wt 175 lb (79.4 kg)   SpO2 100% Comment: room air  BMI 32.01 kg/m       Objective:   Physical Exam  Physical Exam  Constitutional: She is oriented to person, place, and time. She appears well-developed and well-nourished. No distress.  HENT:  Head: Normocephalic and atraumatic.  Right Ear: Tympanic membrane and ear canal normal.  Left Ear: Tympanic membrane and ear canal normal.  Mouth/Throat: Oropharynx is clear and moist.  Eyes: Pupils are equal, round, and reactive to light. No scleral icterus.  Neck: Normal range of motion. No thyromegaly present.  Cardiovascular: Normal rate and regular rhythm.   No murmur heard. Pulmonary/Chest: Effort normal and breath sounds normal. No respiratory distress. He has no wheezes. She has no rales. She exhibits no tenderness.  Abdominal: Soft. Bowel  sounds are normal. She exhibits no distension and no mass. There is no tenderness. There is no rebound and no guarding.  Musculoskeletal: She exhibits no edema.  Lymphadenopathy:    She has no cervical adenopathy.  Neurological: She is alert and oriented to person, place, and time. She has normal patellar reflexes. She exhibits normal muscle tone. Coordination normal.  Skin: Skin is warm and dry.  Psychiatric: She has a normal mood and affect. Her behavior is normal. Judgment and thought content normal.  Breasts: Examined lying Right: Without masses, retractions, discharge or axillary adenopathy.  Left: Without masses, retractions, discharge or axillary adenopathy.  Pelvic: deferred      Assessment & Plan:         Assessment & Plan:  EKG notes TWI in V4 and V5. Pt has multiple risk factors for CAD (obesity, tobacco abuse, htn, hyperlipidemia). Will refer to cardiology for further evaluation.  In the meantime, pt is instructed to go to the ED if she develops chest pain.

## 2016-07-31 NOTE — Progress Notes (Signed)
Pre visit review using our clinic review tool, if applicable. No additional management support is needed unless otherwise documented below in the visit note. 

## 2016-07-31 NOTE — Telephone Encounter (Signed)
Please let pt know that lab is seeing some yeast in her urine. If she is having any vaginal itching symptoms/discharge or dysuria, then we can send rx for diflucan 150mg  PO x 1 to her pharmacy.

## 2016-08-03 NOTE — Telephone Encounter (Signed)
Left detailed message on pt's cell# per DPR re: below results and to call if having any of the below symptoms.

## 2016-08-03 NOTE — Telephone Encounter (Signed)
Patient is returning your call regarding labs °

## 2016-08-03 NOTE — Telephone Encounter (Signed)
Called left message to call back 

## 2016-08-04 NOTE — Telephone Encounter (Signed)
Pt returned CMA's call. She is requesting a call back.

## 2016-08-05 MED ORDER — FLUCONAZOLE 150 MG PO TABS: 150.0000 mg | ORAL_TABLET | Freq: Once | ORAL | 0 refills | Status: AC

## 2016-08-05 NOTE — Addendum Note (Signed)
Addended by: Kelle Darting A on: 08/05/2016 07:08 AM   Modules accepted: Orders

## 2016-08-05 NOTE — Telephone Encounter (Signed)
Notified pt and she reports that she has been having vaginal itching. Sent diflucan Rx. Pt will let us know if symptoms do not improve in a few days.

## 2016-08-08 ENCOUNTER — Ambulatory Visit (HOSPITAL_BASED_OUTPATIENT_CLINIC_OR_DEPARTMENT_OTHER)
Admission: RE | Admit: 2016-08-08 | Discharge: 2016-08-08 | Disposition: A | Discharge status: Home / Self Care | Payer: BLUE CROSS/BLUE SHIELD | Source: Ambulatory Visit | Attending: Family | Admitting: Family

## 2016-08-08 DIAGNOSIS — Z1231 Encounter for screening mammogram for malignant neoplasm of breast: Secondary | ICD-10-CM | POA: Diagnosis present

## 2016-08-08 DIAGNOSIS — Z0000 Encounter for general adult medical examination without abnormal findings: Secondary | ICD-10-CM

## 2016-08-18 ENCOUNTER — Encounter: Payer: Self-pay | Admitting: Cardiovascular Disease

## 2016-08-28 ENCOUNTER — Ambulatory Visit (INDEPENDENT_AMBULATORY_CARE_PROVIDER_SITE_OTHER): Payer: BLUE CROSS/BLUE SHIELD | Admitting: Cardiovascular Disease

## 2016-08-28 ENCOUNTER — Encounter: Payer: Self-pay | Admitting: Cardiovascular Disease

## 2016-08-28 VITALS — BP 140/90 | HR 69 | Ht 64.0 in | Wt 176.0 lb

## 2016-08-28 DIAGNOSIS — I10 Essential (primary) hypertension: Secondary | ICD-10-CM

## 2016-08-28 DIAGNOSIS — R0789 Other chest pain: Secondary | ICD-10-CM | POA: Diagnosis not present

## 2016-08-28 DIAGNOSIS — R9431 Abnormal electrocardiogram [ECG] [EKG]: Secondary | ICD-10-CM

## 2016-08-28 MED ORDER — LOSARTAN POTASSIUM 50 MG PO TABS: 50.0000 mg | ORAL_TABLET | Freq: Every day | ORAL | 3 refills | Status: DC

## 2016-08-28 NOTE — Progress Notes (Signed)
Cardiology Office Note   Date:  08/28/2016   ID:  Nasra Sedeno, DOB 27-Dec-1950, MRN [id number]  PCP:  Nance Pear., NP  Cardiologist:   Mertie Moores, MD   Chief Complaint  Patient presents with  . New Pt    abnormal EKG, Per pt - heart thumping hard and irregular   Problem List 1. Abnormal ECG 2. HTN 3. Hyperlipidemia    History of Present Illness: Janice Meadows is a 65 y.o. female who presents for eval of an abnormal ECG.  Occasional chest pressure  -   Last for few minutes.   They occur with rest and exertion.  There is no radiation. The chest pains are midsternal.   No associated diaphoresis or shortness of breath.  Walks every day -  Works in a [employer] - very active  [place], 7-8 cigarettes a day -  Advised her to stop   Still eats salty foods - bologna, sausage, ham, soup, bacon   Past Medical History:  Diagnosis Date  . High cholesterol   . Hypertension   . Neuropathy [facility])     Past Surgical History:  Procedure Laterality Date  . ABDOMINAL HYSTERECTOMY       Current Outpatient Prescriptions  Medication Sig Dispense Refill  . gabapentin (NEURONTIN) 300 MG capsule Take 600 mg by mouth 2 (two) times daily.    . hydrochlorothiazide (HYDRODIURIL) 25 MG tablet Take 1 tablet (25 mg total) by mouth daily. 90 tablet 1  . potassium chloride SA (K-DUR,KLOR-CON) 20 MEQ tablet Take 1 tablet (20 mEq total) by mouth daily. 90 tablet 1  . simvastatin (ZOCOR) 40 MG tablet Take 1 tablet (40 mg total) by mouth every evening. 90 tablet 1   No current facility-administered medications for this visit.     Allergies:   Review of patient's allergies indicates no known allergies.    Social History:  The patient  reports that she has been smoking.  She has smoked for the past 22.00 years. She has never used smokeless tobacco. She reports that she drinks alcohol. She reports that she does not use drugs.   Family History:  The patient's family history  includes Cancer (age of onset: 51) in her sister; Hypertension in her father and mother; Kidney disease in her daughter.    ROS:  Please see the history of present illness.    Review of Systems: Constitutional:  denies fever, chills, diaphoresis, appetite change and fatigue.  HEENT: denies photophobia, eye pain, redness, hearing loss, ear pain, congestion, sore throat, rhinorrhea, sneezing, neck pain, neck stiffness and tinnitus.  Respiratory: admits to SOB, DOE,  chest tightness,  Cardiovascular: admits to chest pain,   Gastrointestinal: denies nausea, vomiting, abdominal pain, diarrhea, constipation, blood in stool.  Genitourinary: denies dysuria, urgency, frequency, hematuria, flank pain and difficulty urinating.  Musculoskeletal: denies  myalgias, back pain, joint swelling, arthralgias and gait problem.   Skin: denies pallor, rash and wound.  Neurological: denies dizziness, seizures, syncope, weakness, light-headedness, numbness and headaches.   Hematological: denies adenopathy, easy bruising, personal or family bleeding history.  Psychiatric/ Behavioral: denies suicidal ideation, mood changes, confusion, nervousness, sleep disturbance and agitation.       All other systems are reviewed and negative.    PHYSICAL EXAM: VS:  BP 140/90 (BP Location: Right Arm, Patient Position: Sitting, Cuff Size: Normal)   Pulse 69   Ht 5\' 4"  (1.626 m)   Wt 176 lb (79.8 kg)   SpO2 96%   BMI  30.21 kg/m  , BMI Body mass index is 30.21 kg/m. GEN: Well nourished, well developed, in no acute distress  HEENT: normal  Neck: no JVD, carotid bruits, or masses Cardiac: RRR; no murmurs, rubs, or gallops,no edema  Respiratory:  clear to auscultation bilaterally, normal work of breathing GI: soft, nontender, nondistended, + BS MS: no deformity or atrophy  Skin: warm and dry, no rash Neuro:  Strength and sensation are intact Psych: normal   EKG:  EKG is ordered today. The ekg ordered today  demonstrates sinus brady at 57.   TWI  V3-V6.    Recent Labs: 07/31/2016: ALT 14; BUN 17; Creatinine, Ser 0.94; Hemoglobin 13.3; Platelets 279.0; Potassium 3.7; Sodium 141; TSH 0.80    Lipid Panel    Component Value Date/Time   CHOL 176 07/31/2016 0807   TRIG 41.0 07/31/2016 0807   HDL 76.20 07/31/2016 0807   CHOLHDL 2 07/31/2016 0807   VLDL 8.2 07/31/2016 0807   LDLCALC 91 07/31/2016 0807      Wt Readings from Last 3 Encounters:  08/28/16 176 lb (79.8 kg)  07/31/16 175 lb (79.4 kg)  03/09/16 179 lb 12.8 oz (81.6 kg)      Other studies Reviewed: Additional studies/ records that were reviewed today include: . Review of the above records demonstrates:    ASSESSMENT AND PLAN:  1.  Chest pain :   Ms. Sinatra presents with some episodes of chest pain that are concerning for angina. She has multiple risk factors for coronary artery disease-hypertension, hyperlipidemia, cigarette smoking. She has EKG abdomen allergies. We will schedule her for a [place] study for further evaluation. I've advised to stop smoking.  2. Hypertension: She eats a very high salt diet. I discussed salt content of many of her foods that she eats on a regular basis. She'll try to work on a lower salt diet. We will also add losartan 50 mg a day.  Check a basic medical profile in 3 weeks.  I'll see her in 3 months.   Current medicines are reviewed at length with the patient today.  The patient does not have concerns regarding medicines.  Labs/ tests ordered today include:  No orders of the defined types were placed in this encounter.  Disposition:   FU with 3 months     Mertie Moores, MD  08/28/2016 10:32 AM    [facility] [place], [place], [place]  [address] Phone: [phone]; Fax: [phone]

## 2016-08-28 NOTE — Patient Instructions (Addendum)
REDUCE HIGH SODIUM FOODS LIKE CANNED SOUP, GRAVY, SAUCES, READY PREPARED FOODS (FOUND IN FROZEN SECTION), LASAGNA. BACON, SAUSAGE, LUNCH MEAT, FAST FOODS, HOT DOGS, CHIPS, PIZZA. ONE CAN OF SOUP CAN HAVE AS MUCH AS 2000 MG OF SODIUM, WHICH IS AS MUCH AS YOU ARE SUPPOSED TO HAVE IN A DAY   Medication Instructions:  START Losartan 50 mg once daily   Labwork: Your physician recommends that you return for lab work in: 3 weeks for basic metabolic panel   Testing/Procedures: Your physician has requested that you have an echocardiogram. Echocardiography is a painless test that uses sound waves to create images of your heart. It provides your doctor with information about the size and shape of your heart and how well your heart's chambers and valves are working. This procedure takes approximately one hour. There are no restrictions for this procedure.  Your physician has requested that you have a lexiscan myoview. For further information please visit [email or web address]. Please follow instruction sheet, as given.   Follow-Up: Your physician recommends that you schedule a follow-up appointment in: 3 months with Dr. Acie Fredrickson.    If you need a refill on your cardiac medications before your next appointment, please call your pharmacy.   Thank you for choosing [facility]! Christen Bame, RN [phone]

## 2016-08-31 ENCOUNTER — Encounter: Payer: Self-pay | Admitting: Family

## 2016-09-07 ENCOUNTER — Encounter: Payer: Self-pay | Admitting: Family

## 2016-09-07 ENCOUNTER — Ambulatory Visit (INDEPENDENT_AMBULATORY_CARE_PROVIDER_SITE_OTHER): Payer: BLUE CROSS/BLUE SHIELD | Admitting: Family

## 2016-09-07 VITALS — BP 136/90 | HR 61 | Temp 98.3°F | Resp 16 | Ht 60.0 in | Wt 179.2 lb

## 2016-09-07 DIAGNOSIS — J069 Acute upper respiratory infection, unspecified: Secondary | ICD-10-CM | POA: Diagnosis not present

## 2016-09-07 DIAGNOSIS — B9789 Other viral agents as the cause of diseases classified elsewhere: Secondary | ICD-10-CM | POA: Diagnosis not present

## 2016-09-07 MED ORDER — BENZONATATE 100 MG PO CAPS: 100.0000 mg | ORAL_CAPSULE | Freq: Three times a day (TID) | ORAL | 0 refills | Status: DC | PRN

## 2016-09-07 NOTE — Progress Notes (Signed)
Pre visit review using our clinic review tool, if applicable. No additional management support is needed unless otherwise documented below in the visit note. 

## 2016-09-07 NOTE — Patient Instructions (Signed)
Please continue coricidin for congestion. For cough, you may use tessalon as needed. This has been sent to your pharmacy. Call if new/worsening symptoms, if you develop fever or if you are not improved in 3 days.

## 2016-09-07 NOTE — Progress Notes (Signed)
Subjective:    Patient ID: Janice Meadows, female    DOB: [birth year], 65 y.o.   MRN: [id number]  HPI  Janice Meadows is a 65 yr old female who presents today with complaint of nasal congestion/sore throat and cough.  Denies associated fever.  She has been using coricidin which has helped her cough. She reports mild associated wheezing.  Reports sore throat was "bad at first."  Not bad today. Denies associated ear pain.    Review of Systems See HPI  Past Medical History:  Diagnosis Date  . High cholesterol   . Hypertension   . Neuropathy [facility])      Social History   Social History  . Marital status: Single    Spouse name: N/A  . Number of children: N/A  . Years of education: N/A   Occupational History  . Not on file.   Social History Main Topics  . Smoking status: Current Every Day Smoker    Years: 22.00  . Smokeless tobacco: Never Used     Comment: 6-7 cigarettes daily  . Alcohol use 0.0 oz/week     Comment: occasional  . Drug use: No  . Sexual activity: Not on file   Other Topics Concern  . Not on file   Social History Narrative   Works in a [employer]   7 children- one deceased shortly after birth   Lives alone but frequently has family visit   Has 7 grandchildren   Enjoys walking, playing grand kids, park   Dog- outdoor dog.         Past Surgical History:  Procedure Laterality Date  . ABDOMINAL HYSTERECTOMY      Family History  Problem Relation Age of Onset  . Hypertension Mother   . Hypertension Father   . Cancer Sister 4    lymphoma   . Kidney disease Daughter     No Known Allergies  Current Outpatient Prescriptions on File Prior to Visit  Medication Sig Dispense Refill  . gabapentin (NEURONTIN) 300 MG capsule Take 600 mg by mouth 2 (two) times daily.    . hydrochlorothiazide (HYDRODIURIL) 25 MG tablet Take 1 tablet (25 mg total) by mouth daily. 90 tablet 1  . losartan (COZAAR) 50 MG tablet Take 1 tablet (50 mg total) by mouth  daily. 90 tablet 3  . potassium chloride SA (K-DUR,KLOR-CON) 20 MEQ tablet Take 1 tablet (20 mEq total) by mouth daily. 90 tablet 1  . simvastatin (ZOCOR) 40 MG tablet Take 1 tablet (40 mg total) by mouth every evening. 90 tablet 1   No current facility-administered medications on file prior to visit.     BP (!) 147/82 (BP Location: Right Arm, Patient Position: Sitting, Cuff Size: Normal)   Pulse 61   Temp 98.3 F (36.8 C) (Oral)   Resp 16   Ht 5' (1.524 m)   Wt 179 lb 3.2 oz (81.3 kg)   SpO2 97% Comment: RA  BMI 35.00 kg/m       Objective:   Physical Exam  Constitutional: She appears well-developed and well-nourished.  HENT:  Head: Normocephalic and atraumatic.  Right Ear: Tympanic membrane and ear canal normal.  Left Ear: Tympanic membrane and ear canal normal.  Mouth/Throat: No oropharyngeal exudate, posterior oropharyngeal edema or posterior oropharyngeal erythema.  Cardiovascular: Normal rate, regular rhythm and normal heart sounds.   No murmur heard. Pulmonary/Chest: Effort normal and breath sounds normal. No respiratory distress. She has no wheezes.  Psychiatric: She  has a normal mood and affect. Her behavior is normal. Judgment and thought content normal.          Assessment & Plan:  Viral URI with cough- advised pt on supportive measures, nasal saline spray and coricidin for congestion, tessalon prn cough. She is advised to call if new/worsening symptoms, if you develop fever or if you are not improved in 3 days.

## 2016-09-14 ENCOUNTER — Telehealth (HOSPITAL_COMMUNITY): Payer: Self-pay | Admitting: *Deleted

## 2016-09-14 NOTE — Telephone Encounter (Signed)
Left message on voicemail per DPR in reference to upcoming appointment scheduled on 09/21/16 at 1000 with detailed instructions given per Myocardial Perfusion Study Information Sheet for the test. LM to arrive 15 minutes early, and that it is imperative to arrive on time for appointment to keep from having the test rescheduled. If you need to cancel or reschedule your appointment, please call the office within 24 hours of your appointment. Failure to do so may result in a cancellation of your appointment, and a $50 no show fee. Phone number given for call back for any questions.

## 2016-09-15 ENCOUNTER — Ambulatory Visit: Payer: BLUE CROSS/BLUE SHIELD | Admitting: Cardiovascular Disease

## 2016-09-21 ENCOUNTER — Other Ambulatory Visit: Payer: Self-pay

## 2016-09-21 ENCOUNTER — Ambulatory Visit: Payer: BLUE CROSS/BLUE SHIELD | Admitting: *Deleted

## 2016-09-21 ENCOUNTER — Ambulatory Visit (HOSPITAL_BASED_OUTPATIENT_CLINIC_OR_DEPARTMENT_OTHER): Payer: BLUE CROSS/BLUE SHIELD

## 2016-09-21 ENCOUNTER — Ambulatory Visit (HOSPITAL_COMMUNITY): Payer: BLUE CROSS/BLUE SHIELD | Attending: Cardiovascular Disease

## 2016-09-21 VITALS — Ht 64.0 in | Wt 176.0 lb

## 2016-09-21 DIAGNOSIS — F17200 Nicotine dependence, unspecified, uncomplicated: Secondary | ICD-10-CM | POA: Diagnosis not present

## 2016-09-21 DIAGNOSIS — R9431 Abnormal electrocardiogram [ECG] [EKG]: Secondary | ICD-10-CM

## 2016-09-21 DIAGNOSIS — I10 Essential (primary) hypertension: Secondary | ICD-10-CM | POA: Insufficient documentation

## 2016-09-21 DIAGNOSIS — I071 Rheumatic tricuspid insufficiency: Secondary | ICD-10-CM | POA: Insufficient documentation

## 2016-09-21 DIAGNOSIS — R609 Edema, unspecified: Secondary | ICD-10-CM | POA: Diagnosis not present

## 2016-09-21 DIAGNOSIS — I371 Nonrheumatic pulmonary valve insufficiency: Secondary | ICD-10-CM | POA: Insufficient documentation

## 2016-09-21 DIAGNOSIS — E785 Hyperlipidemia, unspecified: Secondary | ICD-10-CM | POA: Diagnosis not present

## 2016-09-21 DIAGNOSIS — R0789 Other chest pain: Secondary | ICD-10-CM | POA: Diagnosis present

## 2016-09-21 DIAGNOSIS — I351 Nonrheumatic aortic (valve) insufficiency: Secondary | ICD-10-CM | POA: Diagnosis not present

## 2016-09-21 LAB — BASIC METABOLIC PANEL
BUN: 18 mg/dL (ref 7–25)
CALCIUM: 9 mg/dL (ref 8.6–10.4)
CO2: 25 mmol/L (ref 20–31)
CREATININE: 0.76 mg/dL (ref 0.50–0.99)
Chloride: 108 mmol/L (ref 98–110)
Glucose, Bld: 86 mg/dL (ref 65–99)
Potassium: 4.6 mmol/L (ref 3.5–5.3)
Sodium: 140 mmol/L (ref 135–146)

## 2016-09-21 LAB — MYOCARDIAL PERFUSION IMAGING
CHL CUP NUCLEAR SDS: 2
CHL CUP NUCLEAR SSS: 4
LHR: 0.29
LV dias vol: 84 mL (ref 46–106)
LV sys vol: 43 mL
Peak HR: 92 {beats}/min
Rest HR: 48 {beats}/min
SRS: 2
TID: 0.94

## 2016-09-21 MED ORDER — TECHNETIUM TC 99M TETROFOSMIN IV KIT
31.8000 | PACK | Freq: Once | INTRAVENOUS | Status: AC | PRN
  Administered 2016-09-21: 31.8 via INTRAVENOUS
  Filled 2016-09-21: qty 32

## 2016-09-21 MED ORDER — TECHNETIUM TC 99M TETROFOSMIN IV KIT
10.5000 | PACK | Freq: Once | INTRAVENOUS | Status: AC | PRN
  Administered 2016-09-21: 10.5 via INTRAVENOUS
  Filled 2016-09-21: qty 11

## 2016-09-21 MED ORDER — REGADENOSON 0.4 MG/5ML IV SOLN
0.4000 mg | Freq: Once | INTRAVENOUS | Status: AC
  Administered 2016-09-21: 0.4 mg via INTRAVENOUS

## 2016-10-25 ENCOUNTER — Other Ambulatory Visit: Payer: Self-pay | Admitting: Family

## 2016-10-27 ENCOUNTER — Other Ambulatory Visit: Payer: Self-pay | Admitting: Family

## 2016-10-27 NOTE — Telephone Encounter (Signed)
HCTZ refill sent to pharmacy. Gabapentin is on current medication list as historical. In refill history it states that Rx was discontinued as "change in therapy".  Please advise request?

## 2016-10-27 NOTE — Telephone Encounter (Signed)
Refill sent per [facility] refill protocol/SLS 01/02

## 2016-12-03 ENCOUNTER — Encounter: Payer: Self-pay | Admitting: Cardiovascular Disease

## 2016-12-07 ENCOUNTER — Encounter: Payer: Self-pay | Admitting: Cardiovascular Disease

## 2016-12-07 ENCOUNTER — Ambulatory Visit (INDEPENDENT_AMBULATORY_CARE_PROVIDER_SITE_OTHER): Payer: Medicare Other | Admitting: Cardiovascular Disease

## 2016-12-07 VITALS — BP 132/84 | HR 78 | Ht 64.0 in | Wt 182.0 lb

## 2016-12-07 DIAGNOSIS — I10 Essential (primary) hypertension: Secondary | ICD-10-CM | POA: Diagnosis not present

## 2016-12-07 NOTE — Progress Notes (Signed)
Cardiology Office Note   Date:  12/07/2016   ID:  Janice Meadows, DOB 11-May-1951, MRN [id number]  PCP:  Nance Pear., NP  Cardiologist:   Mertie Moores, MD   Chief Complaint  Patient presents with  . Chest Pain   Problem List 1. Abnormal ECG 2. HTN 3. Hyperlipidemia    History of Present Illness: Janice Meadows is a 66 y.o. female who presents for eval of an abnormal ECG.  Occasional chest pressure  -   Last for few minutes.   They occur with rest and exertion.  There is no radiation. The chest pains are midsternal.   No associated diaphoresis or shortness of breath.  Walks every day -  Works in a [employer] account [employer] - very active  [place], 7-8 cigarettes a day -  Advised her to stop   Still eats salty foods - bologna, sausage, ham, soup, bacon   Feb. 12, 2018:  Doing wels Still smoking  Has improved her diet   Past Medical History:  Diagnosis Date  . High cholesterol   . Hypertension   . Neuropathy [facility])     Past Surgical History:  Procedure Laterality Date  . ABDOMINAL HYSTERECTOMY       Current Outpatient Prescriptions  Medication Sig Dispense Refill  . benzonatate (TESSALON) 100 MG capsule Take 1 capsule (100 mg total) by mouth 3 (three) times daily as needed. 20 capsule 0  . gabapentin (NEURONTIN) 300 MG capsule Take 600 mg by mouth 2 (two) times daily.    [place] Kitchen gabapentin (NEURONTIN) 300 MG capsule TAKE ONE CAPSULE BY MOUTH EVERY EVENING 90 capsule 0  . hydrochlorothiazide (HYDRODIURIL) 25 MG tablet TAKE 1 TABLET BY MOUTH DAILY 90 tablet 1  . losartan (COZAAR) 50 MG tablet Take 1 tablet (50 mg total) by mouth daily. 90 tablet 3  . potassium chloride SA (K-DUR,KLOR-CON) 20 MEQ tablet TAKE 1 TABLET(20 MEQ) BY MOUTH DAILY 90 tablet 0  . simvastatin (ZOCOR) 40 MG tablet Take 1 tablet (40 mg total) by mouth every evening. 90 tablet 1   No current facility-administered medications for this visit.     Allergies:   Patient has no known allergies.     Social History:  The patient  reports that she has been smoking.  She has smoked for the past 22.00 years. She has never used smokeless tobacco. She reports that she drinks alcohol. She reports that she does not use drugs.   Family History:  The patient's family history includes Cancer (age of onset: 25) in her sister; Hypertension in her father and mother; Kidney disease in her daughter.    ROS:  Please see the history of present illness.    Review of Systems: Constitutional:  denies fever, chills, diaphoresis, appetite change and fatigue.  HEENT: denies photophobia, eye pain, redness, hearing loss, ear pain, congestion, sore throat, rhinorrhea, sneezing, neck pain, neck stiffness and tinnitus.  Respiratory: admits to SOB, DOE,  chest tightness,  Cardiovascular: admits to chest pain,   Gastrointestinal: denies nausea, vomiting, abdominal pain, diarrhea, constipation, blood in stool.  Genitourinary: denies dysuria, urgency, frequency, hematuria, flank pain and difficulty urinating.  Musculoskeletal: denies  myalgias, back pain, joint swelling, arthralgias and gait problem.   Skin: denies pallor, rash and wound.  Neurological: denies dizziness, seizures, syncope, weakness, light-headedness, numbness and headaches.   Hematological: denies adenopathy, easy bruising, personal or family bleeding history.  Psychiatric/ Behavioral: denies suicidal ideation, mood changes, confusion, nervousness, sleep disturbance and agitation.  All other systems are reviewed and negative.    PHYSICAL EXAM: VS:  BP 132/84 (BP Location: Right Arm, Patient Position: Sitting, Cuff Size: Normal)   Pulse 78   Ht 5\' 4"  (1.626 m)   Wt 182 lb (82.6 kg)   SpO2 95%   BMI 31.24 kg/m  , BMI Body mass index is 31.24 kg/m. GEN: Well nourished, well developed, in no acute distress  HEENT: normal  Neck: no JVD, carotid bruits, or masses Cardiac: RRR; no murmurs, rubs, or gallops,no edema  Respiratory:   clear to auscultation bilaterally, normal work of breathing GI: soft, nontender, nondistended, + BS MS: no deformity or atrophy  Skin: warm and dry, no rash Neuro:  Strength and sensation are intact Psych: normal   EKG:  EKG is ordered today. The ekg ordered today demonstrates sinus brady at 57.   TWI  V3-V6.    Recent Labs: 07/31/2016: ALT 14; Hemoglobin 13.3; Platelets 279.0; TSH 0.80 09/21/2016: BUN 18; Creat 0.76; Potassium 4.6; Sodium 140    Lipid Panel    Component Value Date/Time   CHOL 176 07/31/2016 0807   TRIG 41.0 07/31/2016 0807   HDL 76.20 07/31/2016 0807   CHOLHDL 2 07/31/2016 0807   VLDL 8.2 07/31/2016 0807   LDLCALC 91 07/31/2016 0807      Wt Readings from Last 3 Encounters:  12/07/16 182 lb (82.6 kg)  09/21/16 176 lb (79.8 kg)  09/07/16 179 lb 3.2 oz (81.3 kg)      Other studies Reviewed: Additional studies/ records that were reviewed today include: . Review of the above records demonstrates:    ASSESSMENT AND PLAN:  1.  Chest pain :   Janice Meadows Had a Myoview study which revealed no evidence of ischemia. Visually her ejection fraction looks normal with computer calculated an EF of 48%.  Echocardiogram reveals normal left ventricular systolic function.  EF of 55-60%.   She has a grade 1 diastolic dysfunction.   2. Hypertension:  She has improved her diet. She is eating a lot less salt. She seems to be tolerating the losartan quite well. We will ask her primary medical doctor to start writing her prescription for the losartan. I'll see her on an as-needed basis.      Current medicines are reviewed at length with the patient today.  The patient does not have concerns regarding medicines.  Labs/ tests ordered today include:  No orders of the defined types were placed in this encounter.  Disposition:     Mertie Moores, MD  12/07/2016 10:11 AM    [facility] [place], [place], [place]  [address] Phone: [phone]; Fax: [phone]

## 2016-12-07 NOTE — Patient Instructions (Signed)

## 2017-01-23 ENCOUNTER — Other Ambulatory Visit: Payer: Self-pay | Admitting: Family

## 2017-01-25 NOTE — Telephone Encounter (Signed)
Pt due for medicare wellness visit please call pt to schedule. Please put pt on Melissa schedule too for 6 month follow up.

## 2017-01-27 NOTE — Telephone Encounter (Signed)
Pt has been scheduled.  °

## 2017-02-02 ENCOUNTER — Other Ambulatory Visit: Payer: Self-pay | Admitting: Family

## 2017-02-10 ENCOUNTER — Ambulatory Visit (INDEPENDENT_AMBULATORY_CARE_PROVIDER_SITE_OTHER): Payer: Medicare Other | Admitting: Family

## 2017-02-10 ENCOUNTER — Encounter: Payer: Self-pay | Admitting: Family

## 2017-02-10 VITALS — BP 127/65 | HR 65 | Temp 98.0°F | Resp 16 | Ht 64.0 in | Wt 184.6 lb

## 2017-02-10 DIAGNOSIS — G629 Polyneuropathy, unspecified: Secondary | ICD-10-CM

## 2017-02-10 DIAGNOSIS — E785 Hyperlipidemia, unspecified: Secondary | ICD-10-CM | POA: Diagnosis not present

## 2017-02-10 DIAGNOSIS — Z23 Encounter for immunization: Secondary | ICD-10-CM

## 2017-02-10 DIAGNOSIS — I10 Essential (primary) hypertension: Secondary | ICD-10-CM

## 2017-02-10 LAB — BASIC METABOLIC PANEL
BUN: 15 mg/dL (ref 6–23)
CHLORIDE: 107 meq/L (ref 96–112)
CO2: 30 meq/L (ref 19–32)
Calcium: 9.3 mg/dL (ref 8.4–10.5)
Creatinine, Ser: 0.85 mg/dL (ref 0.40–1.20)
GFR: 86.25 mL/min (ref >60.00)
Glucose, Bld: 93 mg/dL (ref 70–99)
POTASSIUM: 4.2 meq/L (ref 3.5–5.1)
SODIUM: 141 meq/L (ref 135–145)

## 2017-02-10 MED ORDER — GABAPENTIN 300 MG PO CAPS: 300.0000 mg | ORAL_CAPSULE | Freq: Two times a day (BID) | ORAL | 1 refills | Status: DC

## 2017-02-10 MED ORDER — HYDROCHLOROTHIAZIDE 25 MG PO TABS: 25.0000 mg | ORAL_TABLET | Freq: Every day | ORAL | 1 refills | Status: DC

## 2017-02-10 MED ORDER — POTASSIUM CHLORIDE CRYS ER 20 MEQ PO TBCR: EXTENDED_RELEASE_TABLET | ORAL | 0 refills | Status: DC

## 2017-02-10 NOTE — Progress Notes (Signed)
Subjective:    Patient ID: Janice Meadows, female    DOB: 1950/11/26, 66 y.o.   MRN: [id number]  HPI  Janice Meadows is a 66 yr old female who presents today for follow up.  1) Hyperlipidemia- maintained on simvastatin. Reports good compliance. Denies myalgia.   Lab Results  Component Value Date   CHOL 176 07/31/2016   HDL 76.20 07/31/2016   LDLCALC 91 07/31/2016   TRIG 41.0 07/31/2016   CHOLHDL 2 07/31/2016   2) HTN- maintained on hctz, losartan and kdur. Denies swelling or SOB.  BP Readings from Last 3 Encounters:  02/10/17 127/65  12/07/16 132/84  09/07/16 136/90   3) Peripheral neuropathy- maintained on gabapentin. Reports that pain is controlled.    Review of Systems See HPI  Past Medical History:  Diagnosis Date  . High cholesterol   . Hypertension   . Neuropathy      Social History   Social History  . Marital status: Single    Spouse name: N/A  . Number of children: N/A  . Years of education: N/A   Occupational History  . Not on file.   Social History Main Topics  . Smoking status: Current Every Day Smoker    Years: 22.00  . Smokeless tobacco: Never Used     Comment: 6-7 cigarettes daily  . Alcohol use 0.0 oz/week     Comment: occasional  . Drug use: No  . Sexual activity: Not on file   Other Topics Concern  . Not on file   Social History Narrative   Works in a [employer]   7 children- one deceased shortly after birth   Lives alone but frequently has family visit   Has 7 grandchildren   Enjoys walking, playing grand kids, park   Dog- outdoor dog.         Past Surgical History:  Procedure Laterality Date  . ABDOMINAL HYSTERECTOMY      Family History  Problem Relation Age of Onset  . Hypertension Mother   . Hypertension Father   . Cancer Sister 84    lymphoma   . Kidney disease Daughter     No Known Allergies  Current Outpatient Prescriptions on File Prior to Visit  Medication Sig Dispense Refill  . gabapentin  (NEURONTIN) 300 MG capsule TAKE ONE CAPSULE BY MOUTH EVERY EVENING (Patient taking differently: TAKE ONE CAPSULE BY mouth twice a day) 90 capsule 0  . hydrochlorothiazide (HYDRODIURIL) 25 MG tablet TAKE 1 TABLET BY MOUTH DAILY 90 tablet 1  . potassium chloride SA (K-DUR,KLOR-CON) 20 MEQ tablet TAKE 1 TABLET(20 MEQ) BY MOUTH DAILY 90 tablet 0  . simvastatin (ZOCOR) 40 MG tablet TAKE 1 TABLET BY MOUTH EVERY EVENING 90 tablet 1  . losartan (COZAAR) 50 MG tablet Take 1 tablet (50 mg total) by mouth daily. 90 tablet 3   No current facility-administered medications on file prior to visit.     BP 127/65 (BP Location: Right Arm, Cuff Size: Large)   Pulse 65   Temp 98 F (36.7 C) (Oral)   Resp 16   Ht 5\' 4"  (1.626 m)   Wt 184 lb 9.6 oz (83.7 kg)   SpO2 100% Comment: room air  BMI 31.69 kg/m       Objective:   Physical Exam  Constitutional: She is oriented to person, place, and time. She appears well-developed and well-nourished.  HENT:  Head: Normocephalic and atraumatic.  Cardiovascular: Normal rate, regular rhythm and normal heart  sounds.   No murmur heard. Pulmonary/Chest: Effort normal and breath sounds normal. No respiratory distress. She has no wheezes.  Musculoskeletal: She exhibits no edema.  Neurological: She is alert and oriented to person, place, and time.  Psychiatric: She has a normal mood and affect. Her behavior is normal. Judgment and thought content normal.          Assessment & Plan:

## 2017-02-10 NOTE — Assessment & Plan Note (Signed)
At goal, tolerating statin, continue same.

## 2017-02-10 NOTE — Patient Instructions (Signed)
Please complete lab work prior to leaving.   

## 2017-02-10 NOTE — Assessment & Plan Note (Signed)
Stable, continue gabapentin

## 2017-02-10 NOTE — Addendum Note (Signed)
Addended by: Kelle Darting A on: 02/10/2017 09:53 AM   Modules accepted: Orders

## 2017-02-10 NOTE — Assessment & Plan Note (Signed)
BP stable on current meds. Continue current medications, obtain follow up bmet.

## 2017-02-10 NOTE — Progress Notes (Signed)
Pre visit review using our clinic review tool, if applicable. No additional management support is needed unless otherwise documented below in the visit note. 

## 2017-02-11 ENCOUNTER — Encounter: Payer: Self-pay | Admitting: Family

## 2017-04-26 ENCOUNTER — Other Ambulatory Visit: Payer: Self-pay | Admitting: Family

## 2017-04-28 HISTORY — PX: TOOTH EXTRACTION: SHX859

## 2017-05-11 ENCOUNTER — Encounter: Payer: Self-pay | Admitting: Behavioral Health

## 2017-05-11 ENCOUNTER — Telehealth: Payer: Self-pay | Admitting: Behavioral Health

## 2017-05-11 NOTE — Telephone Encounter (Signed)
Patient voiced that she's working at the time, but will return the call after 4:00 PM today.

## 2017-05-11 NOTE — Telephone Encounter (Signed)
Pre-Visit Call completed with patient and chart updated.   Pre-Visit Info documented in Specialty Comments under [employer].    

## 2017-05-12 ENCOUNTER — Encounter: Payer: Self-pay | Admitting: Family

## 2017-05-12 ENCOUNTER — Ambulatory Visit (INDEPENDENT_AMBULATORY_CARE_PROVIDER_SITE_OTHER): Payer: Medicare Other | Admitting: Family

## 2017-05-12 VITALS — BP 120/70 | HR 62 | Temp 98.1°F | Resp 18 | Ht 62.0 in | Wt 187.0 lb

## 2017-05-12 DIAGNOSIS — I10 Essential (primary) hypertension: Secondary | ICD-10-CM

## 2017-05-12 DIAGNOSIS — Z1211 Encounter for screening for malignant neoplasm of colon: Secondary | ICD-10-CM | POA: Diagnosis not present

## 2017-05-12 DIAGNOSIS — Z0000 Encounter for general adult medical examination without abnormal findings: Secondary | ICD-10-CM | POA: Diagnosis not present

## 2017-05-12 DIAGNOSIS — Z1212 Encounter for screening for malignant neoplasm of rectum: Secondary | ICD-10-CM | POA: Diagnosis not present

## 2017-05-12 LAB — BASIC METABOLIC PANEL
BUN: 12 mg/dL (ref 6–23)
CO2: 27 meq/L (ref 19–32)
CREATININE: 0.88 mg/dL (ref 0.40–1.20)
Calcium: 9.9 mg/dL (ref 8.4–10.5)
Chloride: 105 mEq/L (ref 96–112)
GFR: 82.8 mL/min (ref >60.00)
GLUCOSE: 90 mg/dL (ref 70–99)
Potassium: 3.4 mEq/L — ABNORMAL LOW (ref 3.5–5.1)
Sodium: 139 mEq/L (ref 135–145)

## 2017-05-12 MED ORDER — ZOSTER VAC RECOMB ADJUVANTED 50 MCG/0.5ML IM SUSR: INTRAMUSCULAR | 1 refills | Status: DC

## 2017-05-12 NOTE — Patient Instructions (Addendum)
Preventive Care 65 Years and Older, Female Preventive care refers to lifestyle choices and visits with your health care provider that can promote health and wellness. What does preventive care include?  A yearly physical exam. This is also called an annual well check.  Dental exams once or twice a year.  Routine eye exams. Ask your health care provider how often you should have your eyes checked.  Personal lifestyle choices, including: ? Daily care of your teeth and gums. ? Regular physical activity. ? Eating a healthy diet. ? Avoiding tobacco and drug use. ? Limiting alcohol use. ? Practicing safe sex. ? Taking low-dose aspirin every day. ? Taking vitamin and mineral supplements as recommended by your health care provider. What happens during an annual well check? The services and screenings done by your health care provider during your annual well check will depend on your age, overall health, lifestyle risk factors, and family history of disease. Counseling Your health care provider may ask you questions about your:  Alcohol use.  Tobacco use.  Drug use.  Emotional well-being.  Home and relationship well-being.  Sexual activity.  Eating habits.  History of falls.  Memory and ability to understand (cognition).  Work and work environment.  Reproductive health.  Screening You may have the following tests or measurements:  Height, weight, and BMI.  Blood pressure.  Lipid and cholesterol levels. These may be checked every 5 years, or more frequently if you are over 50 years old.  Skin check.  Lung cancer screening. You may have this screening every year starting at age 55 if you have a 30-pack-year history of smoking and currently smoke or have quit within the past 15 years.  Fecal occult blood test (FOBT) of the stool. You may have this test every year starting at age 50.  Flexible sigmoidoscopy or colonoscopy. You may have a sigmoidoscopy every 5 years or  a colonoscopy every 10 years starting at age 50.  Hepatitis C blood test.  Hepatitis B blood test.  Sexually transmitted disease (STD) testing.  Diabetes screening. This is done by checking your blood sugar (glucose) after you have not eaten for a while (fasting). You may have this done every 1-3 years.  Bone density scan. This is done to screen for osteoporosis. You may have this done starting at age 65.  Mammogram. This may be done every 1-2 years. Talk to your health care provider about how often you should have regular mammograms.  Talk with your health care provider about your test results, treatment options, and if necessary, the need for more tests. Vaccines Your health care provider may recommend certain vaccines, such as:  Influenza vaccine. This is recommended every year.  Tetanus, diphtheria, and acellular pertussis (Tdap, Td) vaccine. You may need a Td booster every 10 years.  Varicella vaccine. You may need this if you have not been vaccinated.  Zoster vaccine. You may need this after age 60.  Measles, mumps, and rubella (MMR) vaccine. You may need at least one dose of MMR if you were born in 1957 or later. You may also need a second dose.  Pneumococcal 13-valent conjugate (PCV13) vaccine. One dose is recommended after age 65.  Pneumococcal polysaccharide (PPSV23) vaccine. One dose is recommended after age 65.  Meningococcal vaccine. You may need this if you have certain conditions.  Hepatitis A vaccine. You may need this if you have certain conditions or if you travel or work in places where you may be exposed to hepatitis   A.  Hepatitis B vaccine. You may need this if you have certain conditions or if you travel or work in places where you may be exposed to hepatitis B.  Haemophilus influenzae type b (Hib) vaccine. You may need this if you have certain conditions.  Talk to your health care provider about which screenings and vaccines you need and how often you  need them. This information is not intended to replace advice given to you by your health care provider. Make sure you discuss any questions you have with your health care provider. Document Released: 11/08/2015 Document Revised: 07/01/2016 Document Reviewed: 08/13/2015 Elsevier Interactive Patient Education  2017 [employer]. Please complete health care power of attorney form and have notarized then bring [place] a copy for your chart. Work on [employer], quitting smoking, and weight loss. Please go to [facility] to get the shingles shot. It is a 2 dose series. Second dose is 2-6 months after the first.  Schedule a routine eye exam with an optometrist.

## 2017-05-12 NOTE — Progress Notes (Signed)
Subjective:    Janice Meadows is a 66 y.o. female who presents for a Welcome to Medicare exam.   Review of Systems    Patient presents today for complete physical.  Immunizations: due for shingrix Diet: reports diet needs improvement, eating out more Exercise: enjoys walking Colonoscopy: never, due Dexa: 7/16 Pap Smear: hysterectomy Mammogram: 08/13/16       Objective:    Today's Vitals   05/12/17 1132  BP: 120/70  Pulse: 62  Resp: 18  Temp: 98.1 F (36.7 C)  TempSrc: Oral  SpO2: 97%  Weight: 187 lb (84.8 kg)  Height: 5\' 2"  (1.575 m)  Body mass index is 34.2 kg/m.  Medications Outpatient Encounter Prescriptions as of 05/12/2017  Medication Sig  . gabapentin (NEURONTIN) 300 MG capsule Take 1 capsule (300 mg total) by mouth 2 (two) times daily.  . hydrochlorothiazide (HYDRODIURIL) 25 MG tablet Take 1 tablet (25 mg total) by mouth daily.  Marland Kitchen losartan (COZAAR) 50 MG tablet Take 1 tablet (50 mg total) by mouth daily.  . potassium chloride SA (K-DUR,KLOR-CON) 20 MEQ tablet TAKE 1 TABLET(20 MEQ) BY MOUTH DAILY  . simvastatin (ZOCOR) 40 MG tablet TAKE 1 TABLET BY MOUTH EVERY EVENING  . Zoster Vac Recomb Adjuvanted [facility]) injection Inject 89mcg IM now and again in 2-6 months   No facility-administered encounter medications on file as of 05/12/2017.      History: Past Medical History:  Diagnosis Date  . High cholesterol   . Hypertension   . Neuropathy    Past Surgical History:  Procedure Laterality Date  . ABDOMINAL HYSTERECTOMY    . TOOTH EXTRACTION  04/28/2017    Family History  Problem Relation Age of Onset  . Hypertension Mother   . Hypertension Father   . Cancer Sister 5       lymphoma   . Kidney disease Daughter   . Gout Brother    Social History   Occupational History  . Not on file.   Social History Main Topics  . Smoking status: Current Every Day Smoker    Years: 22.00  . Smokeless tobacco: Never Used     Comment: 6-7 cigarettes daily    . Alcohol use 0.0 oz/week     Comment: occasional  . Drug use: No  . Sexual activity: No    Tobacco Counseling Ready to quit: Yes Counseling given: Yes   Immunizations and Health Maintenance Immunization History  Administered Date(s) Administered  . Influenza,inj,Quad PF,36+ Mos 09/09/2015, 07/31/2016  . Pneumococcal Conjugate-13 02/10/2017  . Pneumococcal Polysaccharide-23 10/26/2012  . Tdap 03/04/2015   Health Maintenance Due  Topic Date Due  . COLONOSCOPY  10/29/2001    Activities of Daily Living In your present state of health, do you have any difficulty performing the following activities: 05/12/2017 09/07/2016  Hearing? N N  Vision? N N  Difficulty concentrating or making decisions? N N  Walking or climbing stairs? Y N  Dressing or bathing? N N  Doing errands, shopping? N N  Some recent data might be hidden    Physical Exam    Physical Exam  Constitutional: She is oriented to person, place, and time. She appears well-developed and well-nourished. No distress.  HENT:  Head: Normocephalic and atraumatic.  Right Ear: Tympanic membrane and ear canal normal.  Left Ear: Tympanic membrane and ear canal normal.  Mouth/Throat: Oropharynx is clear and moist.  Eyes: Pupils are equal, round, and reactive to light. No scleral icterus.  Neck: Normal range of motion.  No thyromegaly present.  Cardiovascular: Normal rate and regular rhythm.   No murmur heard. Pulmonary/Chest: Effort normal and breath sounds normal. No respiratory distress. He has no wheezes. She has no rales. She exhibits no tenderness.  Abdominal: Soft. Bowel sounds are normal. She exhibits no distension and no mass. There is no tenderness. There is no rebound and no guarding.  Musculoskeletal: She exhibits no edema.  Lymphadenopathy:    She has no cervical adenopathy.  Neurological: She is alert and oriented to person, place, and time. She exhibits normal muscle tone. Coordination normal.  Skin: Skin is  warm and dry.  Psychiatric: She has a normal mood and affect. Her behavior is normal. Judgment and thought content normal.  Breasts: Examined lying Right: Without masses, retractions, discharge or axillary adenopathy.  Left: Without masses, retractions, discharge or axillary adenopathy.           Assessment & Plan:     Advanced Directives: no Does Patient Have a Medical Advance Directive?: No Would patient like information on creating a medical advance directive?: Yes (MAU/Ambulatory/Procedural Areas - Information given)    Assessment:    This is a routine wellness examination for this patient .   Vision/Hearing screen  Visual Acuity Screening   Right eye Left eye Both eyes  Without correction: 20/40-2 20/30-1 20/25-2  With correction:       Dietary issues and exercise activities discussed:     Goals    None     Depression Screen PHQ 2/9 Scores 05/12/2017 09/07/2016 07/31/2016  PHQ - 2 Score 0 0 0     Fall Risk Fall Risk  05/12/2017  Falls in the past year? No    Cognitive Function:  see minicog      Patient Care Team: Debbrah Alar, NP as PCP - General (Internal Medicine)     Plan:   Refer for colonoscopy, rx provided for Shingix.  I have personally reviewed and noted the following in the patient's chart:   . Medical and social history . Use of alcohol, tobacco or illicit drugs  . Current medications and supplements . Functional ability and status . Nutritional status . Physical activity . Advanced directives . List of other physicians . Hospitalizations, surgeries, and ER visits in previous 12 months . Vitals . Screenings to include cognitive, depression, and falls . Referrals and appointments  In addition, I have reviewed and discussed with patient certain preventive protocols, quality metrics, and best practice recommendations. A written personalized care plan for preventive services as well as general preventive health recommendations  were provided to patient.     O'SULLIVAN,Jaeleigh Monaco S., NP 05/12/2017

## 2017-05-14 ENCOUNTER — Telehealth: Payer: Self-pay | Admitting: Family

## 2017-05-14 DIAGNOSIS — E876 Hypokalemia: Secondary | ICD-10-CM

## 2017-05-14 NOTE — Telephone Encounter (Signed)
Potassium is low, is she taking kdur regularly? Please take 2 tabs today, then 1 tab once daily, repeat bmet in 1 week dx hypokalemia.

## 2017-05-14 NOTE — Telephone Encounter (Signed)
Notified pt and she states she has missed a few days recently. Will resume as below. Lab appt scheduled for 05/21/17 at 7am and future order entered.

## 2017-05-14 NOTE — Telephone Encounter (Signed)
Left message for pt to return my call.

## 2017-05-21 ENCOUNTER — Other Ambulatory Visit (INDEPENDENT_AMBULATORY_CARE_PROVIDER_SITE_OTHER): Payer: Medicare Other

## 2017-05-21 ENCOUNTER — Telehealth: Payer: Self-pay | Admitting: Family

## 2017-05-21 ENCOUNTER — Other Ambulatory Visit: Payer: Self-pay | Admitting: Emergency Medicine

## 2017-05-21 DIAGNOSIS — E876 Hypokalemia: Secondary | ICD-10-CM

## 2017-05-21 LAB — BASIC METABOLIC PANEL
BUN: 14 mg/dL (ref 6–23)
CHLORIDE: 108 meq/L (ref 96–112)
CO2: 27 meq/L (ref 19–32)
CREATININE: 1.01 mg/dL (ref 0.40–1.20)
Calcium: 9.2 mg/dL (ref 8.4–10.5)
GFR: 70.62 mL/min (ref >60.00)
Glucose, Bld: 89 mg/dL (ref 70–99)
POTASSIUM: 3.4 meq/L — AB (ref 3.5–5.1)
SODIUM: 143 meq/L (ref 135–145)

## 2017-05-21 MED ORDER — POTASSIUM CHLORIDE CRYS ER 20 MEQ PO TBCR: 20.0000 meq | EXTENDED_RELEASE_TABLET | Freq: Two times a day (BID) | ORAL | 1 refills | Status: DC

## 2017-05-21 NOTE — Telephone Encounter (Signed)
Please contact patient and let her know that her potassium remains mildly low. I would like for her to increase her potassium supplement to 20 mEq by mouth twice daily. She should repeat basic metabolic panel in 1 week.

## 2017-05-21 NOTE — Telephone Encounter (Signed)
Pt informed. Lab visit scheduled for repeat lab in 1 week.

## 2017-05-24 ENCOUNTER — Other Ambulatory Visit: Payer: Self-pay | Admitting: Family

## 2017-05-27 DIAGNOSIS — E876 Hypokalemia: Secondary | ICD-10-CM

## 2017-05-28 ENCOUNTER — Other Ambulatory Visit (INDEPENDENT_AMBULATORY_CARE_PROVIDER_SITE_OTHER): Payer: Medicare Other

## 2017-05-28 DIAGNOSIS — E876 Hypokalemia: Secondary | ICD-10-CM | POA: Diagnosis not present

## 2017-05-28 LAB — BASIC METABOLIC PANEL
BUN: 13 mg/dL (ref 6–23)
CHLORIDE: 108 meq/L (ref 96–112)
CO2: 27 meq/L (ref 19–32)
CREATININE: 0.88 mg/dL (ref 0.40–1.20)
Calcium: 8.8 mg/dL (ref 8.4–10.5)
GFR: 82.79 mL/min (ref >60.00)
Glucose, Bld: 89 mg/dL (ref 70–99)
POTASSIUM: 3.7 meq/L (ref 3.5–5.1)
Sodium: 141 mEq/L (ref 135–145)

## 2017-06-02 ENCOUNTER — Encounter: Payer: Self-pay | Admitting: Gastroenterology

## 2017-07-16 ENCOUNTER — Ambulatory Visit: Payer: Medicare Other

## 2017-07-16 VITALS — Ht 60.5 in | Wt 190.2 lb

## 2017-07-16 DIAGNOSIS — Z1211 Encounter for screening for malignant neoplasm of colon: Secondary | ICD-10-CM

## 2017-07-16 MED ORDER — SUPREP BOWEL PREP KIT 17.5-3.13-1.6 GM/177ML PO SOLN: 1.0000 | Freq: Once | ORAL | 0 refills | Status: AC

## 2017-07-16 NOTE — Progress Notes (Signed)
No allergies to eggs or soy No past problems with anesthesia No diet meds No home oxygen  Declined emmi 

## 2017-07-23 ENCOUNTER — Encounter: Payer: Self-pay | Admitting: Gastroenterology

## 2017-07-31 ENCOUNTER — Other Ambulatory Visit: Payer: Self-pay | Admitting: Family

## 2017-08-04 MED ORDER — POTASSIUM CHLORIDE CRYS ER 20 MEQ PO TBCR: 20.0000 meq | EXTENDED_RELEASE_TABLET | Freq: Two times a day (BID) | ORAL | 1 refills | Status: DC

## 2017-08-06 ENCOUNTER — Encounter: Payer: Self-pay | Admitting: Gastroenterology

## 2017-08-06 ENCOUNTER — Ambulatory Visit (AMBULATORY_SURGERY_CENTER): Payer: Medicare Other | Admitting: Gastroenterology

## 2017-08-06 VITALS — BP 114/78 | HR 59 | Temp 96.8°F | Resp 11 | Ht 60.5 in | Wt 190.0 lb

## 2017-08-06 DIAGNOSIS — Z1211 Encounter for screening for malignant neoplasm of colon: Secondary | ICD-10-CM

## 2017-08-06 DIAGNOSIS — D123 Benign neoplasm of transverse colon: Secondary | ICD-10-CM | POA: Diagnosis not present

## 2017-08-06 DIAGNOSIS — D120 Benign neoplasm of cecum: Secondary | ICD-10-CM

## 2017-08-06 MED ORDER — SODIUM CHLORIDE 0.9 % IV SOLN: 500.0000 mL | INTRAVENOUS | Status: DC

## 2017-08-06 NOTE — Progress Notes (Signed)
Pt states no medical or surgical changes since previsit

## 2017-08-06 NOTE — Op Note (Signed)
[facility] Patient Name: Moncia Annas Procedure Date: 08/06/2017 11:07 AM MRN: [id number] Endoscopist: [place]. Loletha Carrow , MD Age: 66 Referring MD:  Date of Birth: 12-13-1950 Gender: Female Account #: [id number] Procedure:                Colonoscopy Indications:              Screening for colorectal malignant neoplasm, This                            is the patient's first colonoscopy Medicines:                Monitored Anesthesia Care Procedure:                Pre-Anesthesia Assessment:                           - Prior to the procedure, a History and Physical                            was performed, and patient medications and                            allergies were reviewed. The patient's tolerance of                            previous anesthesia was also reviewed. The risks                            and benefits of the procedure and the sedation                            options and risks were discussed with the patient.                            All questions were answered, and informed consent                            was obtained. Prior Anticoagulants: The patient has                            taken no previous anticoagulant or antiplatelet                            agents. ASA Grade Assessment: II - A patient with                            mild systemic disease. After reviewing the risks                            and benefits, the patient was deemed in                            satisfactory condition to undergo the procedure.  After obtaining informed consent, the colonoscope                            was passed under direct vision. Throughout the                            procedure, the patient's blood pressure, pulse, and                            oxygen saturations were monitored continuously. The                            Colonoscope was introduced through the anus and                            advanced to the the cecum,  identified by                            appendiceal orifice and ileocecal valve. The                            colonoscopy was performed without difficulty. The                            patient tolerated the procedure well. The quality                            of the bowel preparation was excellent. The                            ileocecal valve, appendiceal orifice, and rectum                            were photographed. The quality of the bowel                            preparation was evaluated using the BBPS [facility]                            Bowel Preparation Scale) with scores of: Right                            Colon = 2, Transverse Colon = 3 and Left Colon = 3.                            The total BBPS score equals 8. The bowel                            preparation used was SUPREP. Scope In: 11:13:51 AM Scope Out: 11:26:44 AM Scope Withdrawal Time: 0 hours 10 minutes 38 seconds  Total Procedure Duration: 0 hours 12 minutes 53 seconds  Findings:                 The perianal and digital rectal  examinations were                            normal.                           A 6 mm polyp was found in the cecum. The polyp was                            semi-pedunculated. The polyp was removed with a hot                            snare. Resection and retrieval were complete.                           Two sessile polyps were found in the transverse                            colon. The polyps were 4 mm in size. These polyps                            were removed with a cold snare. Resection and                            retrieval were complete.                           Diverticula were found in the left colon and right                            colon.                           The exam was otherwise without abnormality on                            direct and retroflexion views. Complications:            No immediate complications. Estimated Blood Loss:     Estimated blood  loss was minimal. Impression:               - One 6 mm polyp in the cecum, removed with a hot                            snare. Resected and retrieved.                           - Two 4 mm polyps in the transverse colon, removed                            with a cold snare. Resected and retrieved.                           - Diverticulosis in the left colon and in the right  colon.                           - The examination was otherwise normal on direct                            and retroflexion views. Recommendation:           - Patient has a contact number available for                            emergencies. The signs and symptoms of potential                            delayed complications were discussed with the                            patient. Return to normal activities tomorrow.                            Written discharge instructions were provided to the                            patient.                           - Resume previous diet.                           - Continue present medications.                           - Await pathology results.                           - Repeat colonoscopy is recommended for                            surveillance. The colonoscopy date will be                            determined after pathology results from today's                            exam become available for review. Henry L. Loletha Carrow, MD 08/06/2017 11:32:28 AM This report has been signed electronically.

## 2017-08-06 NOTE — Patient Instructions (Signed)
Please read handouts on Diverticulosis and Polyps.   YOU HAD AN ENDOSCOPIC PROCEDURE TODAY AT [facility]:   Refer to the procedure report that was given to you for any specific questions about what was found during the examination.  If the procedure report does not answer your questions, please call your gastroenterologist to clarify.  If you requested that your care partner not be given the details of your procedure findings, then the procedure report has been included in a sealed envelope for you to review at your convenience later.  YOU SHOULD EXPECT: Some feelings of bloating in the abdomen. Passage of more gas than usual.  Walking can help get rid of the air that was put into your GI tract during the procedure and reduce the bloating. If you had a lower endoscopy (such as a colonoscopy or flexible sigmoidoscopy) you may notice spotting of blood in your stool or on the toilet paper. If you underwent a bowel prep for your procedure, you may not have a normal bowel movement for a few days.  Please Note:  You might notice some irritation and congestion in your nose or some drainage.  This is from the oxygen used during your procedure.  There is no need for concern and it should clear up in a day or so.  SYMPTOMS TO REPORT IMMEDIATELY:   Following lower endoscopy (colonoscopy or flexible sigmoidoscopy):  Excessive amounts of blood in the stool  Significant tenderness or worsening of abdominal pains  Swelling of the abdomen that is new, acute  Fever of 100F or higher   For urgent or emergent issues, a gastroenterologist can be reached at any hour by calling [phone].   DIET:  We do recommend a small meal at first, but then you may proceed to your regular diet.  Drink plenty of fluids but you should avoid alcoholic beverages for 24 hours.  ACTIVITY:  You should plan to take it easy for the rest of today and you should NOT DRIVE or use heavy machinery until tomorrow  (because of the sedation medicines used during the test).    FOLLOW UP: Our staff will call the number listed on your records the next business day following your procedure to check on you and address any questions or concerns that you may have regarding the information given to you following your procedure. If we do not reach you, we will leave a message.  However, if you are feeling well and you are not experiencing any problems, there is no need to return our call.  We will assume that you have returned to your regular daily activities without incident.  If any biopsies were taken you will be contacted by phone or by letter within the next 1-3 weeks.  Please call us at [phone] if you have not heard about the biopsies in 3 weeks.    SIGNATURES/CONFIDENTIALITY: You and/or your care partner have signed paperwork which will be entered into your electronic medical record.  These signatures attest to the fact that that the information above on your After Visit Summary has been reviewed and is understood.  Full responsibility of the confidentiality of this discharge information lies with you and/or your care-partner.  Thank you for letting us take care of your healthcare needs today.

## 2017-08-06 NOTE — Progress Notes (Signed)
Called to room to assist during endoscopic procedure.  Patient ID and intended procedure confirmed with present staff. Received instructions for my participation in the procedure from the performing physician.  

## 2017-08-06 NOTE — Progress Notes (Signed)
To recovery, report to RN, VSS. 

## 2017-08-09 ENCOUNTER — Telehealth: Payer: Self-pay

## 2017-08-09 NOTE — Telephone Encounter (Signed)
  Follow up Call-  Call back number 08/06/2017  Post procedure Call Back phone  # [phone]  Permission to leave phone message Yes     Patient questions:  Do you have a fever, pain , or abdominal swelling? No. Pain Score  0 *  Have you tolerated food without any problems? Yes.    Have you been able to return to your normal activities? Yes.    Do you have any questions about your discharge instructions: Diet   No. Medications  No. Follow up visit  No.  Do you have questions or concerns about your Care? No.  Actions: * If pain score is 4 or above: No action needed, pain <4.

## 2017-08-16 ENCOUNTER — Encounter: Payer: Self-pay | Admitting: Gastroenterology

## 2017-08-24 ENCOUNTER — Other Ambulatory Visit: Payer: Self-pay | Admitting: Family

## 2017-09-14 ENCOUNTER — Ambulatory Visit (INDEPENDENT_AMBULATORY_CARE_PROVIDER_SITE_OTHER): Payer: Medicare Other

## 2017-09-14 DIAGNOSIS — Z23 Encounter for immunization: Secondary | ICD-10-CM

## 2017-10-14 ENCOUNTER — Telehealth: Payer: Self-pay | Admitting: Cardiovascular Disease

## 2017-10-14 MED ORDER — LOSARTAN POTASSIUM 50 MG PO TABS: 50.0000 mg | ORAL_TABLET | Freq: Every day | ORAL | 0 refills | Status: DC

## 2017-10-14 NOTE — Telephone Encounter (Signed)
Pt's medication was sent to pt's pharmacy as requested. Confirmation received.  °

## 2017-10-14 NOTE — Telephone Encounter (Signed)
New message     *STAT* If patient is at the pharmacy, call can be transferred to refill team.   1. Which medications need to be refilled? (please list name of each medication and dose if known) losartin 50mg  1xday  2. Which pharmacy/location (including street and city if local pharmacy) is medication to be sent to? [facility] on [address] in [place]   3. Do they need a 30 day or 90 day supply? [place]

## 2017-10-23 ENCOUNTER — Other Ambulatory Visit: Payer: Self-pay | Admitting: Family

## 2017-11-05 ENCOUNTER — Ambulatory Visit: Payer: Medicare Other | Admitting: Family Medicine

## 2017-11-05 ENCOUNTER — Encounter: Payer: Self-pay | Admitting: Family Medicine

## 2017-11-05 VITALS — BP 138/88 | HR 78 | Temp 98.4°F | Ht 64.0 in | Wt 195.2 lb

## 2017-11-05 DIAGNOSIS — J069 Acute upper respiratory infection, unspecified: Secondary | ICD-10-CM | POA: Diagnosis not present

## 2017-11-05 DIAGNOSIS — B9789 Other viral agents as the cause of diseases classified elsewhere: Secondary | ICD-10-CM | POA: Diagnosis not present

## 2017-11-05 MED ORDER — BENZONATATE 100 MG PO CAPS: 100.0000 mg | ORAL_CAPSULE | Freq: Three times a day (TID) | ORAL | 0 refills | Status: DC | PRN

## 2017-11-05 MED ORDER — LEVOCETIRIZINE DIHYDROCHLORIDE 5 MG PO TABS: 5.0000 mg | ORAL_TABLET | Freq: Every evening | ORAL | 0 refills | Status: DC

## 2017-11-05 NOTE — Progress Notes (Signed)
Chief Complaint  Patient presents with  . Cough    congestion    Janice Meadows here for URI complaints.  Duration: 3 days  Associated symptoms: sinus congestion, rhinorrhea and cough Denies: sinus pain, itchy watery eyes, ear pain, ear drainage, sore throat, wheezing, shortness of breath, myalgia and fevers/rigors Treatment to date: Cough syrups, cough drops Sick contacts: No  ROS:  Const: Denies fevers HEENT: As noted in HPI Lungs: No SOB  Past Medical History:  Diagnosis Date  . High cholesterol   . Hypertension   . Neuropathy    Family History  Problem Relation Age of Onset  . Hypertension Mother   . Hypertension Father   . Cancer Sister 22       lymphoma   . Kidney disease Daughter   . Gout Brother   . Colon cancer Neg Hx     BP 138/88 (BP Location: Left Arm, Patient Position: Sitting, Cuff Size: Normal)   Pulse 78   Temp 98.4 F (36.9 C) (Oral)   Ht 5\' 4"  (1.626 m)   Wt 195 lb 4 oz (88.6 kg)   SpO2 97%   BMI 33.51 kg/m  General: Awake, alert, appears stated age HEENT: AT, [place], ears patent b/l and TM's neg, nares patent w/o discharge, pharynx pink and without exudates, MMM Neck: No masses or asymmetry Heart: RRR Lungs: CTAB, no accessory muscle use Psych: Age appropriate judgment and insight, normal mood and affect  Viral URI with cough - Plan: levocetirizine (XYZAL) 5 MG tablet, benzonatate (TESSALON) 100 MG capsule  Orders as above.Zyrtec for nose. Continue to push fluids, practice good hand hygiene, cover mouth when coughing. F/u prn. If starting to experience fevers, shaking, or shortness of breath, seek immediate care. Pt voiced understanding and agreement to the plan.  [place], DO 11/05/17 1:10 PM

## 2017-11-05 NOTE — Patient Instructions (Addendum)
Continue to push fluids, practice good hand hygiene, and cover your mouth if you cough.  If you start having fevers, shaking or shortness of breath, seek immediate care.  Claritin (loratadine), Allegra (fexofenadine), Zyrtec (cetirizine); these are listed in order from weakest to strongest. Generic, and therefore cheaper, options are in the parentheses.   There are available OTC, and the generic versions, which may be cheaper, are in parentheses. Show this to a pharmacist if you have trouble finding any of these items.   Let us know if you need anything.

## 2017-11-05 NOTE — Progress Notes (Signed)
Pre visit review using our clinic review tool, if applicable. No additional management support is needed unless otherwise documented below in the visit note. 

## 2017-11-12 ENCOUNTER — Encounter: Payer: Self-pay | Admitting: Family

## 2017-11-12 ENCOUNTER — Ambulatory Visit (INDEPENDENT_AMBULATORY_CARE_PROVIDER_SITE_OTHER): Payer: Medicare Other | Admitting: Family

## 2017-11-12 VITALS — BP 124/76 | HR 77 | Temp 98.3°F | Resp 18 | Ht 64.0 in | Wt 195.0 lb

## 2017-11-12 DIAGNOSIS — G609 Hereditary and idiopathic neuropathy, unspecified: Secondary | ICD-10-CM

## 2017-11-12 DIAGNOSIS — E785 Hyperlipidemia, unspecified: Secondary | ICD-10-CM

## 2017-11-12 DIAGNOSIS — I10 Essential (primary) hypertension: Secondary | ICD-10-CM

## 2017-11-12 LAB — COMPREHENSIVE METABOLIC PANEL
ALK PHOS: 85 U/L (ref 39–117)
ALT: 11 U/L (ref 0–35)
AST: 24 U/L (ref 0–37)
Albumin: 3.6 g/dL (ref 3.5–5.2)
BUN: 12 mg/dL (ref 6–23)
CHLORIDE: 104 meq/L (ref 96–112)
CO2: 32 meq/L (ref 19–32)
Calcium: 9.2 mg/dL (ref 8.4–10.5)
Creatinine, Ser: 0.91 mg/dL (ref 0.40–1.20)
GFR: 79.53 mL/min (ref >60.00)
GLUCOSE: 94 mg/dL (ref 70–99)
Potassium: 4.2 mEq/L (ref 3.5–5.1)
SODIUM: 142 meq/L (ref 135–145)
Total Bilirubin: 0.4 mg/dL (ref 0.2–1.2)
Total Protein: 7.2 g/dL (ref 6.0–8.3)

## 2017-11-12 LAB — LIPID PANEL
Cholesterol: 174 mg/dL (ref 0–200)
HDL: 67.2 mg/dL (ref >39.00)
LDL CALC: 96 mg/dL (ref 0–99)
NONHDL: 107.14
Total CHOL/HDL Ratio: 3
Triglycerides: 57 mg/dL (ref 0.0–149.0)
VLDL: 11.4 mg/dL (ref 0.0–40.0)

## 2017-11-12 MED ORDER — LOSARTAN POTASSIUM 50 MG PO TABS: 50.0000 mg | ORAL_TABLET | Freq: Every day | ORAL | 0 refills | Status: DC

## 2017-11-12 MED ORDER — POTASSIUM CHLORIDE CRYS ER 20 MEQ PO TBCR: 20.0000 meq | EXTENDED_RELEASE_TABLET | Freq: Two times a day (BID) | ORAL | 0 refills | Status: DC

## 2017-11-12 MED ORDER — SIMVASTATIN 40 MG PO TABS: 40.0000 mg | ORAL_TABLET | Freq: Every evening | ORAL | 0 refills | Status: DC

## 2017-11-12 MED ORDER — GABAPENTIN 300 MG PO CAPS: ORAL_CAPSULE | ORAL | 0 refills | Status: DC

## 2017-11-12 MED ORDER — HYDROCHLOROTHIAZIDE 25 MG PO TABS: ORAL_TABLET | ORAL | 0 refills | Status: DC

## 2017-11-12 NOTE — Progress Notes (Signed)
Subjective:    Patient ID: Janice Meadows, female    DOB: [birth year], 67 y.o.   MRN: [id number]  HPI  Patient is a 67 yr old female who presents today for follow up.  1) HTN-current medications include hydrochlorothiazide, losartan, and K Dur. She denies CP/SOB or swelling.   BP Readings from Last 3 Encounters:  11/12/17 124/76  11/05/17 138/88  08/06/17 114/78   2) Hyperlipidemia-she is maintained on simvastatin. Denies myalgia.  Lab Results  Component Value Date   CHOL 176 07/31/2016   HDL 76.20 07/31/2016   LDLCALC 91 07/31/2016   TRIG 41.0 07/31/2016   CHOLHDL 2 07/31/2016   3) Peripheral neuropathy-she is maintained on gabapentin. Reports neuropathy symptoms are well controlled.     Review of Systems    see HPI  Past Medical History:  Diagnosis Date  . High cholesterol   . Hypertension   . Neuropathy      Social History   Socioeconomic History  . Marital status: Single    Spouse name: Not on file  . Number of children: Not on file  . Years of education: Not on file  . Highest education level: Not on file  Social Needs  . Financial resource strain: Not on file  . Food insecurity - worry: Not on file  . Food insecurity - inability: Not on file  . Transportation needs - medical: Not on file  . Transportation needs - non-medical: Not on file  Occupational History  . Not on file  Tobacco Use  . Smoking status: Current Every Day Smoker    Years: 22.00  . Smokeless tobacco: Never Used  . Tobacco comment: 5 cigarettes daily  Substance and Sexual Activity  . Alcohol use: Yes    Alcohol/week: 0.0 oz    Comment: occasional  . Drug use: No  . Sexual activity: No  Other Topics Concern  . Not on file  Social History Narrative   Works in a [employer]   7 children- one deceased shortly after birth   Lives alone but frequently has family visit   Has 7 grandchildren   Enjoys walking, playing grand kids, park   Dog- outdoor dog.      Past  Surgical History:  Procedure Laterality Date  . ABDOMINAL HYSTERECTOMY    . BREAST CYST EXCISION     bilat  . TOOTH EXTRACTION  04/28/2017    Family History  Problem Relation Age of Onset  . Hypertension Mother   . Hypertension Father   . Cancer Sister 16       lymphoma   . Kidney disease Daughter   . Gout Brother   . Colon cancer Neg Hx     No Known Allergies  Current Outpatient Medications on File Prior to Visit  Medication Sig Dispense Refill  . benzonatate (TESSALON) 100 MG capsule Take 1 capsule (100 mg total) by mouth 3 (three) times daily as needed. 30 capsule 0  . gabapentin (NEURONTIN) 300 MG capsule TAKE 1 CAPSULE(300 MG) BY MOUTH TWICE DAILY 180 capsule 1  . hydrochlorothiazide (HYDRODIURIL) 25 MG tablet TAKE 1 TABLET(25 MG) BY MOUTH DAILY 90 tablet 1  . levocetirizine (XYZAL) 5 MG tablet Take 1 tablet (5 mg total) by mouth every evening. 30 tablet 0  . losartan (COZAAR) 50 MG tablet Take 1 tablet (50 mg total) by mouth daily. Please make yearly appt with Dr. Acie Fredrickson for February before anymore refills. 1st attempt 90 tablet 0  .  Multiple Vitamin (MULTIVITAMIN) tablet Take 1 tablet by mouth daily. One-a-day womens    . potassium chloride SA (K-DUR,KLOR-CON) 20 MEQ tablet Take 1 tablet (20 mEq total) by mouth 2 (two) times daily. 90 tablet 1  . simvastatin (ZOCOR) 40 MG tablet TAKE 1 TABLET BY MOUTH EVERY EVENING 90 tablet 1   No current facility-administered medications on file prior to visit.     BP 124/76 (BP Location: Right Arm, Cuff Size: Large)   Pulse 77   Temp 98.3 F (36.8 C) (Oral)   Resp 18   Ht 5\' 4"  (1.626 m)   Wt 195 lb (88.5 kg)   SpO2 99%   BMI 33.47 kg/m    Objective:   Physical Exam  Constitutional: She is oriented to person, place, and time. She appears well-developed and well-nourished.  HENT:  Head: Normocephalic and atraumatic.  Cardiovascular: Normal rate, regular rhythm and normal heart sounds.  No murmur heard. Pulmonary/Chest:  Effort normal and breath sounds normal. No respiratory distress. She has no wheezes.  Musculoskeletal: She exhibits no edema.  Neurological: She is alert and oriented to person, place, and time.  Psychiatric: She has a normal mood and affect. Her behavior is normal. Judgment and thought content normal.          Assessment & Plan:  HTN- BP at goal, continue current meds obtain follow up cmet.  Hyperlipidemia- tolerating statin, obtain follow up lipid panel.  Peripheral neuropathy- stable on gabapentin. Continue same.

## 2017-11-12 NOTE — Patient Instructions (Signed)
Please complete lab work prior to leaving.   

## 2017-12-31 ENCOUNTER — Ambulatory Visit: Payer: Medicare Other | Admitting: Physician Assistant

## 2017-12-31 ENCOUNTER — Encounter: Payer: Self-pay | Admitting: Physician Assistant

## 2017-12-31 VITALS — BP 122/84 | HR 61 | Ht 64.0 in | Wt 198.4 lb

## 2017-12-31 DIAGNOSIS — Z720 Tobacco use: Secondary | ICD-10-CM | POA: Diagnosis not present

## 2017-12-31 DIAGNOSIS — I10 Essential (primary) hypertension: Secondary | ICD-10-CM

## 2017-12-31 MED ORDER — LOSARTAN POTASSIUM 50 MG PO TABS: 50.0000 mg | ORAL_TABLET | Freq: Every day | ORAL | 3 refills | Status: DC

## 2017-12-31 NOTE — Progress Notes (Signed)
Cardiology Office Note:    Date:  12/31/2017   ID:  Zelene Barga, DOB 1951-03-31, MRN [id number]  PCP:  Debbrah Alar, NP  Cardiologist:  Mertie Moores, MD   Referring MD: Debbrah Alar, NP   Chief Complaint  Patient presents with  . Follow-up    Hypertension    History of Present Illness:    Janice Meadows is a 67 y.o. female with hypertension, hyperlipidemia.  She was evaluated by Dr. Acie Fredrickson in November 2017 for chest pain and an abnormal EKG (inferior and anterior T wave inversions).  Nuclear stress test demonstrated no ischemia.  Echo demonstrated normal LV function with mild diastolic dysfunction and mild aortic insufficiency.  Last seen by Dr. Acie Fredrickson 11/2016.  Janice Meadows returns for follow-up.  She is here alone.  She denies chest pain, shortness of breath, syncope, orthopnea, PND or edema.  Prior CV studies:   The following studies were reviewed today:  Nuclear stress test 09/21/16 Normal resting and stress perfusion. No ischemia or infarction EF Estimated EF 48% but visually looks normal suggest echo / MRI correlation   Echo 09/21/16 EF 55-60, normal wall motion, grade 1 diastolic dysfunction, mild AI  Past Medical History:  Diagnosis Date  . High cholesterol   . Hypertension   . Neuropathy     Past Surgical History:  Procedure Laterality Date  . ABDOMINAL HYSTERECTOMY    . BREAST CYST EXCISION     bilat  . TOOTH EXTRACTION  04/28/2017    Current Medications: Current Meds  Medication Sig  . gabapentin (NEURONTIN) 300 MG capsule TAKE 1 CAPSULE(300 MG) BY MOUTH TWICE DAILY  . hydrochlorothiazide (HYDRODIURIL) 25 MG tablet TAKE 1 TABLET(25 MG) BY MOUTH DAILY  . losartan (COZAAR) 50 MG tablet Take 1 tablet (50 mg total) by mouth daily.  . Multiple Vitamin (MULTIVITAMIN) tablet Take 1 tablet by mouth daily. One-a-day womens  . potassium chloride SA (K-DUR,KLOR-CON) 20 MEQ tablet Take 1 tablet (20 mEq total) by mouth 2 (two) times daily.  . simvastatin  (ZOCOR) 40 MG tablet Take 1 tablet (40 mg total) by mouth every evening.     Allergies:   Patient has no known allergies.   Social History   Tobacco Use  . Smoking status: Current Every Day Smoker    Years: 22.00  . Smokeless tobacco: Never Used  . Tobacco comment: 5 cigarettes daily  Substance Use Topics  . Alcohol use: Yes    Alcohol/week: 0.0 oz    Comment: occasional  . Drug use: No     Family Hx: The patient's family history includes Cancer (age of onset: 3) in her sister; Gout in her brother; Hypertension in her father and mother; Kidney disease in her daughter. There is no history of Colon cancer.  ROS:   Please see the history of present illness.    ROS All other systems reviewed and are negative.   EKGs/Labs/Other Test Reviewed:    EKG:  EKG is  ordered today.  The ekg ordered today demonstrates normal sinus rhythm, heart rate 61, normal axis, nonspecific ST-T wave changes, QTC 400 ms  Recent Labs: 11/12/2017: ALT 11; BUN 12; Creatinine, Ser 0.91; Potassium 4.2; Sodium 142   Recent Lipid Panel Lab Results  Component Value Date/Time   CHOL 174 11/12/2017 07:28 AM   TRIG 57.0 11/12/2017 07:28 AM   HDL 67.20 11/12/2017 07:28 AM   CHOLHDL 3 11/12/2017 07:28 AM   LDLCALC 96 11/12/2017 07:28 AM    Physical Exam:  VS:  BP 122/84   Pulse 61   Ht 5\' 4"  (1.626 m)   Wt 198 lb 6.4 oz (90 kg)   BMI 34.06 kg/m     Wt Readings from Last 3 Encounters:  12/31/17 198 lb 6.4 oz (90 kg)  11/12/17 195 lb (88.5 kg)  11/05/17 195 lb 4 oz (88.6 kg)     Physical Exam  Constitutional: She is oriented to person, place, and time. She appears well-developed and well-nourished. No distress.  HENT:  Head: Normocephalic and atraumatic.  Neck: No JVD present.  Cardiovascular: Normal rate and regular rhythm.  No murmur heard. Pulmonary/Chest: Effort normal. She has no wheezes.  Abdominal: Soft.  Musculoskeletal: She exhibits no edema.  Neurological: She is alert and  oriented to person, place, and time.  Skin: Skin is warm and dry.    ASSESSMENT & PLAN:    #1.  Essential hypertension  The patient's blood pressure is controlled on her current regimen.  Continue current therapy.  She can follow-up with cardiology as needed.  She can get further refills of losartan with her primary care provider.  #2.  Tobacco abuse I have recommended cessation.  She is trying to quit smoking.  Dispo:  Return as needed with Dr. Acie Fredrickson or Richardson Dopp, PA-C.   Medication Adjustments/Labs and Tests Ordered: Current medicines are reviewed at length with the patient today.  Concerns regarding medicines are outlined above.  Tests Ordered: Orders Placed This Encounter  Procedures  . EKG 12-Lead   Medication Changes: Meds ordered this encounter  Medications  . losartan (COZAAR) 50 MG tablet    Sig: Take 1 tablet (50 mg total) by mouth daily.    Dispense:  90 tablet    Refill:  3    Order Specific Question:   Supervising Provider    Answer:   Dorothy Spark [[id number]]    Signed, Richardson Dopp, PA-C  12/31/2017 9:51 AM    [facility] [address] [place], [place], [place]  [address] Phone: [phone]; Fax: [phone]

## 2017-12-31 NOTE — Patient Instructions (Signed)
Medication Instructions:  No changes.  I refilled your Losartan.  Labwork: None   Testing/Procedures: None   Follow-Up: Mertie Moores, MD or Richardson Dopp, PA-C as needed.  Any Other Special Instructions Will Be Listed Below (If Applicable). Your primary care provider can fill your Losartan in the future.  If you need a refill on your cardiac medications before your next appointment, please call your pharmacy.

## 2018-01-05 ENCOUNTER — Other Ambulatory Visit: Payer: Self-pay | Admitting: Family

## 2018-02-16 ENCOUNTER — Other Ambulatory Visit: Payer: Self-pay | Admitting: Family

## 2018-04-06 ENCOUNTER — Other Ambulatory Visit: Payer: Self-pay | Admitting: Family

## 2018-05-13 ENCOUNTER — Encounter: Payer: Medicare Other | Admitting: Family

## 2018-05-16 ENCOUNTER — Other Ambulatory Visit: Payer: Self-pay | Admitting: Family

## 2018-06-22 ENCOUNTER — Ambulatory Visit (INDEPENDENT_AMBULATORY_CARE_PROVIDER_SITE_OTHER): Payer: Medicare Other | Admitting: Family

## 2018-06-22 ENCOUNTER — Encounter: Payer: Self-pay | Admitting: Family

## 2018-06-22 VITALS — BP 126/77 | HR 77 | Temp 98.7°F | Resp 16 | Ht 62.25 in | Wt 192.0 lb

## 2018-06-22 DIAGNOSIS — R0989 Other specified symptoms and signs involving the circulatory and respiratory systems: Secondary | ICD-10-CM

## 2018-06-22 DIAGNOSIS — Z23 Encounter for immunization: Secondary | ICD-10-CM | POA: Diagnosis not present

## 2018-06-22 DIAGNOSIS — Z1239 Encounter for other screening for malignant neoplasm of breast: Secondary | ICD-10-CM

## 2018-06-22 DIAGNOSIS — G629 Polyneuropathy, unspecified: Secondary | ICD-10-CM

## 2018-06-22 DIAGNOSIS — I10 Essential (primary) hypertension: Secondary | ICD-10-CM

## 2018-06-22 DIAGNOSIS — E785 Hyperlipidemia, unspecified: Secondary | ICD-10-CM | POA: Diagnosis not present

## 2018-06-22 DIAGNOSIS — Z0000 Encounter for general adult medical examination without abnormal findings: Secondary | ICD-10-CM

## 2018-06-22 DIAGNOSIS — J309 Allergic rhinitis, unspecified: Secondary | ICD-10-CM

## 2018-06-22 DIAGNOSIS — E348 Other specified endocrine disorders: Secondary | ICD-10-CM

## 2018-06-22 LAB — COMPREHENSIVE METABOLIC PANEL
ALT: 10 U/L (ref 0–35)
AST: 21 U/L (ref 0–37)
Albumin: 3.5 g/dL (ref 3.5–5.2)
Alkaline Phosphatase: 87 U/L (ref 39–117)
BUN: 12 mg/dL (ref 6–23)
CALCIUM: 9 mg/dL (ref 8.4–10.5)
CHLORIDE: 107 meq/L (ref 96–112)
CO2: 28 mEq/L (ref 19–32)
CREATININE: 0.92 mg/dL (ref 0.40–1.20)
GFR: 78.39 mL/min (ref >60.00)
Glucose, Bld: 96 mg/dL (ref 70–99)
POTASSIUM: 3.8 meq/L (ref 3.5–5.1)
Sodium: 143 mEq/L (ref 135–145)
Total Bilirubin: 0.4 mg/dL (ref 0.2–1.2)
Total Protein: 6.7 g/dL (ref 6.0–8.3)

## 2018-06-22 LAB — LIPID PANEL
CHOL/HDL RATIO: 2
Cholesterol: 159 mg/dL (ref 0–200)
HDL: 65.1 mg/dL (ref >39.00)
LDL CALC: 80 mg/dL (ref 0–99)
NonHDL: 93.5
TRIGLYCERIDES: 69 mg/dL (ref 0.0–149.0)
VLDL: 13.8 mg/dL (ref 0.0–40.0)

## 2018-06-22 MED ORDER — LORATADINE 10 MG PO TABS: 10.0000 mg | ORAL_TABLET | Freq: Every day | ORAL | 11 refills | Status: DC

## 2018-06-22 MED ORDER — CALCIUM CARBONATE-VITAMIN D 600-400 MG-UNIT PO TABS: 1.0000 | ORAL_TABLET | Freq: Two times a day (BID) | ORAL | Status: AC

## 2018-06-22 NOTE — Progress Notes (Signed)
Subjective:    Patient ID: Janice Meadows, female    DOB: Mar 13, 1951, 67 y.o.   MRN: [id number]  HPI  Patient presents today for complete physical.  Immunizations: tdap 9/16, pneumovax 2014, prevnar 4/18. Declines zostavax due to cost ($168 each) Diet: reports fair diet Wt Readings from Last 3 Encounters:  06/22/18 192 lb (87.1 kg)  12/31/17 198 lb 6.4 oz (90 kg)  11/12/17 195 lb (88.5 kg)  Exercise: walking Colonoscopy:  08/06/17 Dexa: 7/16 Pap Smear: hysterectomy Mammogram: 10/17 Dental: up to date Vision: due  HTN- maintained on losaratan, hctz.   BP Readings from Last 3 Encounters:  12/31/17 122/84  11/12/17 124/76  11/05/17 138/88   Hyperlipidemia- maintained on simvastatin.  Reports rare leg cramps.  Lab Results  Component Value Date   CHOL 174 11/12/2017   HDL 67.20 11/12/2017   LDLCALC 96 11/12/2017   TRIG 57.0 11/12/2017   CHOLHDL 3 11/12/2017   Neuropathy- continues gabapentin.  Reports that her neuropathy is well controlled.    Review of Systems  Constitutional: Negative for unexpected weight change.  HENT: Positive for rhinorrhea.   Eyes: Negative for visual disturbance.  Respiratory: Negative for cough and shortness of breath.   Cardiovascular: Negative for chest pain and leg swelling.  Gastrointestinal: Negative for constipation and diarrhea.  Genitourinary: Negative for dysuria, frequency and hematuria.  Musculoskeletal: Negative for arthralgias and myalgias.  Skin: Negative for rash.  Neurological: Negative for headaches.  Hematological: Negative for adenopathy.  Psychiatric/Behavioral:       Denies depression sxs       Past Medical History:  Diagnosis Date  . High cholesterol   . Hypertension   . Neuropathy      Social History   Socioeconomic History  . Marital status: Single    Spouse name: Not on file  . Number of children: Not on file  . Years of education: Not on file  . Highest education level: Not on file  Occupational  History  . Not on file  Social Needs  . Financial resource strain: Not on file  . Food insecurity:    Worry: Not on file    Inability: Not on file  . Transportation needs:    Medical: Not on file    Non-medical: Not on file  Tobacco Use  . Smoking status: Current Every Day Smoker    Years: 22.00  . Smokeless tobacco: Never Used  . Tobacco comment: 5 cigarettes daily  Substance and Sexual Activity  . Alcohol use: Yes    Alcohol/week: 0.0 standard drinks    Comment: occasional  . Drug use: No  . Sexual activity: Never  Lifestyle  . Physical activity:    Days per week: Not on file    Minutes per session: Not on file  . Stress: Not on file  Relationships  . Social connections:    Talks on phone: Not on file    Gets together: Not on file    Attends religious service: Not on file    Active member of club or organization: Not on file    Attends meetings of clubs or organizations: Not on file    Relationship status: Not on file  . Intimate partner violence:    Fear of current or ex partner: Not on file    Emotionally abused: Not on file    Physically abused: Not on file    Forced sexual activity: Not on file  Other Topics Concern  . Not on file  Social History Narrative   Works in a [employer]   7 children- one deceased shortly after birth   Lives alone but frequently has family visit   Has 7 grandchildren   Enjoys walking, playing grand kids, park   Dog- outdoor dog.      Past Surgical History:  Procedure Laterality Date  . ABDOMINAL HYSTERECTOMY    . BREAST CYST EXCISION     bilat  . TOOTH EXTRACTION  04/28/2017    Family History  Problem Relation Age of Onset  . Hypertension Mother   . Hypertension Father   . Cancer Sister 66       lymphoma   . Hypertension Sister   . Kidney disease Daughter   . Stroke Daughter   . Hypertension Sister   . Hypertension Sister   . Hypertension Brother   . Hypertension Brother   . Gout Brother   .  Hypertension Brother   . Hypertension Brother   . Colon cancer Neg Hx     No Known Allergies  Current Outpatient Medications on File Prior to Visit  Medication Sig Dispense Refill  . gabapentin (NEURONTIN) 300 MG capsule TAKE 1 CAPSULE(300 MG) BY MOUTH TWICE DAILY 180 capsule 0  . hydrochlorothiazide (HYDRODIURIL) 25 MG tablet TAKE 1 TABLET(25 MG) BY MOUTH DAILY 90 tablet 0  . losartan (COZAAR) 50 MG tablet Take 1 tablet (50 mg total) by mouth daily. 90 tablet 3  . Multiple Vitamin (MULTIVITAMIN) tablet Take 1 tablet by mouth daily. One-a-day womens    . potassium chloride SA (K-DUR,KLOR-CON) 20 MEQ tablet TAKE 1 TABLET(20 MEQ) BY MOUTH DAILY 180 tablet 1  . simvastatin (ZOCOR) 40 MG tablet TAKE 1 TABLET(40 MG) BY MOUTH EVERY EVENING 90 tablet 0   No current facility-administered medications on file prior to visit.     Pulse 77   Temp 98.7 F (37.1 C) (Oral)   Resp 16   Ht 5' 2.25" (1.581 m)   Wt 192 lb (87.1 kg)   SpO2 97%   BMI 34.84 kg/m    Objective:   Physical Exam Physical Exam  Constitutional: She is oriented to person, place, and time. She appears well-developed and well-nourished. No distress.  HENT:  Head: Normocephalic and atraumatic.  Right Ear: Tympanic membrane and ear canal normal.  Left Ear: Tympanic membrane and ear canal normal.  Mouth/Throat: Oropharynx is clear and moist.  Eyes: Pupils are equal, round, and reactive to light. No scleral icterus.  Neck: Normal range of motion. No thyromegaly present.  Cardiovascular: Normal rate and regular rhythm.   No murmur heard. + JVD noted bilaterally Pulmonary/Chest: Effort normal and breath sounds normal. No respiratory distress. He has no wheezes. She has no rales. She exhibits no tenderness.  Abdominal: Soft. Bowel sounds are normal. She exhibits no distension and no mass. There is no tenderness. There is no rebound and no guarding.  Musculoskeletal: She exhibits no edema.  Lymphadenopathy:    She has no  cervical adenopathy.  Neurological: She is alert and oriented to person, place, and time. She has normal patellar reflexes. She exhibits normal muscle tone. Coordination normal.  Skin: Skin is warm and dry.  Psychiatric: She has a normal mood and affect. Her behavior is normal. Judgment and thought content normal.  Breasts: Examined lying Right: Without masses, retractions, discharge or axillary adenopathy.  Left: Without masses, retractions, discharge or axillary adenopathy.  Pelvic: deferred           Assessment &  Plan:   Preventative care- discussed healthy diet, exercise, weight loss. Obtain follow up labs. Refer for dexa, mammo, add caltrate for [facility].  Pneumovax 23 booster today  Allergic Rhinitis- seasonal, uncontrolled-add claritin.  HTN- BP stable, continue same.  Hyperlipidemia- obtain follow up lipid panel, continue statin.  Neuropathy- stable on gabapentin, continue same.   JVD- new finding. Obtain 2D echo.       Assessment & Plan:

## 2018-06-22 NOTE — Patient Instructions (Addendum)
Please schedule a routine eye exam. You should be contacted about scheduling mammogram and bone density. Add caltrate 600mg  +D one tab twice daily for bone health. Add claritin 10mg  once daily to help with your allergies.

## 2018-06-22 NOTE — Addendum Note (Signed)
Addended by: Kelle Darting A on: 06/22/2018 08:43 AM   Modules accepted: Orders

## 2018-06-24 ENCOUNTER — Encounter: Payer: Self-pay | Admitting: Family

## 2018-06-24 ENCOUNTER — Encounter (HOSPITAL_BASED_OUTPATIENT_CLINIC_OR_DEPARTMENT_OTHER): Payer: Self-pay

## 2018-06-24 ENCOUNTER — Ambulatory Visit (HOSPITAL_BASED_OUTPATIENT_CLINIC_OR_DEPARTMENT_OTHER)
Admission: RE | Admit: 2018-06-24 | Discharge: 2018-06-24 | Disposition: A | Discharge status: Home / Self Care | Payer: Medicare Other | Source: Ambulatory Visit | Attending: Family | Admitting: Family

## 2018-06-24 DIAGNOSIS — Z1231 Encounter for screening mammogram for malignant neoplasm of breast: Secondary | ICD-10-CM | POA: Diagnosis not present

## 2018-06-24 DIAGNOSIS — Z13820 Encounter for screening for osteoporosis: Secondary | ICD-10-CM | POA: Diagnosis not present

## 2018-06-24 DIAGNOSIS — Z1239 Encounter for other screening for malignant neoplasm of breast: Secondary | ICD-10-CM

## 2018-06-24 DIAGNOSIS — M069 Rheumatoid arthritis, unspecified: Secondary | ICD-10-CM | POA: Diagnosis not present

## 2018-06-24 DIAGNOSIS — Z780 Asymptomatic menopausal state: Secondary | ICD-10-CM | POA: Diagnosis not present

## 2018-06-24 DIAGNOSIS — M329 Systemic lupus erythematosus, unspecified: Secondary | ICD-10-CM | POA: Diagnosis not present

## 2018-06-24 DIAGNOSIS — F17200 Nicotine dependence, unspecified, uncomplicated: Secondary | ICD-10-CM | POA: Diagnosis not present

## 2018-06-24 DIAGNOSIS — E348 Other specified endocrine disorders: Secondary | ICD-10-CM | POA: Insufficient documentation

## 2018-06-24 DIAGNOSIS — M85851 Other specified disorders of bone density and structure, right thigh: Secondary | ICD-10-CM | POA: Diagnosis not present

## 2018-06-30 ENCOUNTER — Ambulatory Visit (HOSPITAL_BASED_OUTPATIENT_CLINIC_OR_DEPARTMENT_OTHER)
Admission: RE | Admit: 2018-06-30 | Discharge: 2018-06-30 | Disposition: A | Discharge status: Home / Self Care | Payer: Medicare Other | Source: Ambulatory Visit | Attending: Family | Admitting: Family

## 2018-06-30 DIAGNOSIS — I10 Essential (primary) hypertension: Secondary | ICD-10-CM | POA: Insufficient documentation

## 2018-06-30 DIAGNOSIS — R0989 Other specified symptoms and signs involving the circulatory and respiratory systems: Secondary | ICD-10-CM | POA: Diagnosis not present

## 2018-06-30 DIAGNOSIS — I351 Nonrheumatic aortic (valve) insufficiency: Secondary | ICD-10-CM | POA: Insufficient documentation

## 2018-06-30 DIAGNOSIS — E785 Hyperlipidemia, unspecified: Secondary | ICD-10-CM | POA: Diagnosis not present

## 2018-06-30 NOTE — Progress Notes (Signed)
  Echocardiogram 2D Echocardiogram has been performed.  Janice Meadows T Shaquila Sigman 06/30/2018, 9:59 AM

## 2018-08-05 ENCOUNTER — Other Ambulatory Visit: Payer: Self-pay | Admitting: Family

## 2018-08-10 ENCOUNTER — Ambulatory Visit: Payer: Medicare Other

## 2018-08-12 ENCOUNTER — Ambulatory Visit (INDEPENDENT_AMBULATORY_CARE_PROVIDER_SITE_OTHER): Payer: Medicare Other

## 2018-08-12 DIAGNOSIS — Z23 Encounter for immunization: Secondary | ICD-10-CM | POA: Insufficient documentation

## 2018-08-12 NOTE — Progress Notes (Signed)
Patient came in to have her flu shot. She tolerated the flu vaccine well in her left arm

## 2018-08-12 NOTE — Addendum Note (Signed)
Addended by: Magdalene Molly A on: 08/12/2018 03:50 PM   Modules accepted: Level of Service

## 2018-08-17 ENCOUNTER — Other Ambulatory Visit: Payer: Self-pay | Admitting: Family

## 2018-08-19 ENCOUNTER — Other Ambulatory Visit: Payer: Self-pay

## 2018-08-19 NOTE — Patient Outreach (Signed)
[facility]) Care Management  08/19/2018  Janice Meadows [birth year] [id number]   Medication Adherence call to Janice Meadows left a message for patient to call back patient is due on Simvastatin 40 mg under [place].   [facility] Management Direct Dial [phone]  Fax [phone] [email or web address]

## 2019-01-06 ENCOUNTER — Ambulatory Visit: Payer: Medicare Other | Admitting: *Deleted

## 2019-01-11 ENCOUNTER — Telehealth: Payer: Self-pay | Admitting: *Deleted

## 2019-01-11 NOTE — Progress Notes (Deleted)
Subjective:   Janice Meadows is a 68 y.o. female who presents for Medicare Annual (Subsequent) preventive examination.  Review of Systems: No ROS.  Medicare Wellness Visit. Additional risk factors are reflected in the social history.   Sleep patterns: Home Safety/Smoke Alarms: Feels safe in home. Smoke alarms in place.     Female:   Pap- hysterectomy.      Mammo-       Dexa scan-        CCS-     Objective:     Vitals: There were no vitals taken for this visit.  There is no height or weight on file to calculate BMI.  Advanced Directives 08/06/2017 07/16/2017 05/12/2017 06/11/2015 12/26/2014  Does Patient Have a Medical Advance Directive? No No No No No  Would patient like information on creating a medical advance directive? - - Yes (MAU/Ambulatory/Procedural Areas - Information given) No - patient declined information No - patient declined information    Tobacco Social History   Tobacco Use  Smoking Status Current Every Day Smoker  . Years: 22.00  Smokeless Tobacco Never Used  Tobacco Comment   5 cigarettes daily     Ready to quit: Not Answered Counseling given: Not Answered Comment: 5 cigarettes daily   Clinical Intake:                       Past Medical History:  Diagnosis Date  . High cholesterol   . Hypertension   . Neuropathy    Past Surgical History:  Procedure Laterality Date  . ABDOMINAL HYSTERECTOMY    . BREAST CYST EXCISION     bilat  . TOOTH EXTRACTION  04/28/2017   Family History  Problem Relation Age of Onset  . Hypertension Mother   . Hypertension Father   . Cancer Sister 24       lymphoma   . Hypertension Sister   . Kidney disease Daughter   . Stroke Daughter   . Hypertension Sister   . Hypertension Sister   . Hypertension Brother   . Hypertension Brother   . Gout Brother   . Hypertension Brother   . Hypertension Brother   . Colon cancer Neg Hx    Social History   Socioeconomic History  . Marital status: Single   Spouse name: Not on file  . Number of children: Not on file  . Years of education: Not on file  . Highest education level: Not on file  Occupational History  . Not on file  Social Needs  . Financial resource strain: Not on file  . Food insecurity:    Worry: Not on file    Inability: Not on file  . Transportation needs:    Medical: Not on file    Non-medical: Not on file  Tobacco Use  . Smoking status: Current Every Day Smoker    Years: 22.00  . Smokeless tobacco: Never Used  . Tobacco comment: 5 cigarettes daily  Substance and Sexual Activity  . Alcohol use: Yes    Alcohol/week: 0.0 standard drinks    Comment: occasional  . Drug use: No  . Sexual activity: Never  Lifestyle  . Physical activity:    Days per week: Not on file    Minutes per session: Not on file  . Stress: Not on file  Relationships  . Social connections:    Talks on phone: Not on file    Gets together: Not on file    Attends religious  service: Not on file    Active member of club or organization: Not on file    Attends meetings of clubs or organizations: Not on file    Relationship status: Not on file  Other Topics Concern  . Not on file  Social History Narrative   Works in a [employer]   7 children- one deceased shortly after birth   Lives alone but frequently has family visit   Has 7 grandchildren   Enjoys walking, playing grand kids, park   Dog- outdoor dog.      Outpatient Encounter Medications as of 01/12/2019  Medication Sig  . Calcium Carbonate-Vitamin D 600-400 MG-UNIT tablet Take 1 tablet by mouth 2 (two) times daily.  Marland Kitchen gabapentin (NEURONTIN) 300 MG capsule TAKE 1 CAPSULE(300 MG) BY MOUTH TWICE DAILY  . hydrochlorothiazide (HYDRODIURIL) 25 MG tablet TAKE 1 TABLET(25 MG) BY MOUTH DAILY  . loratadine (CLARITIN) 10 MG tablet Take 1 tablet (10 mg total) by mouth daily.  Marland Kitchen losartan (COZAAR) 50 MG tablet Take 1 tablet (50 mg total) by mouth daily.  . Multiple Vitamin  (MULTIVITAMIN) tablet Take 1 tablet by mouth daily. One-a-day womens  . potassium chloride SA (K-DUR,KLOR-CON) 20 MEQ tablet TAKE 1 TABLET(20 MEQ) BY MOUTH DAILY  . simvastatin (ZOCOR) 40 MG tablet TAKE 1 TABLET BY MOUTH EVERY EVENING   No facility-administered encounter medications on file as of 01/12/2019.     Activities of Daily Living No flowsheet data found.  Patient Care Team: Debbrah Alar, NP as PCP - General (Internal Medicine) Nahser, Wonda Cheng, MD as PCP - Cardiology (Cardiology)    Assessment:   This is a routine wellness examination for Janice Meadows. Physical assessment deferred to PCP.  Exercise Activities and Dietary recommendations   Diet (meal preparation, eat out, water intake, caffeinated beverages, dairy products, fruits and vegetables): {Desc; diets:16563} Breakfast: Lunch:  Dinner:      Goals   None     Fall Risk Fall Risk  06/22/2018 05/12/2017 09/07/2016 07/31/2016  Falls in the past year? No No No No    Depression Screen PHQ 2/9 Scores 06/22/2018 05/12/2017 09/07/2016 07/31/2016  PHQ - 2 Score 0 0 0 0  PHQ- 9 Score 0 - - -     Cognitive Function        Immunization History  Administered Date(s) Administered  . Influenza, High Dose Seasonal PF 09/14/2017, 08/12/2018  . Influenza,inj,Quad PF,6+ Mos 09/09/2015, 07/31/2016  . Pneumococcal Conjugate-13 02/10/2017  . Pneumococcal Polysaccharide-23 10/26/2012, 06/22/2018  . Tdap 03/04/2015   Screening Tests Health Maintenance  Topic Date Due  . MAMMOGRAM  06/24/2020  . COLONOSCOPY  08/06/2020  . TETANUS/TDAP  03/03/2025  . INFLUENZA VACCINE  Completed  . DEXA SCAN  Completed  . Hepatitis C Screening  Completed  . PNA vac Low Risk Adult  Completed     Plan:   ***   I have personally reviewed and noted the following in the patient's chart:   . Medical and social history . Use of alcohol, tobacco or illicit drugs  . Current medications and supplements . Functional ability and status .  Nutritional status . Physical activity . Advanced directives . List of other physicians . Hospitalizations, surgeries, and ER visits in previous 12 months . Vitals . Screenings to include cognitive, depression, and falls . Referrals and appointments  In addition, I have reviewed and discussed with patient certain preventive protocols, quality metrics, and best practice recommendations. A written personalized care plan for  preventive services as well as general preventive health recommendations were provided to patient.     Shela Nevin, [place]  01/11/2019

## 2019-01-11 NOTE — Telephone Encounter (Signed)
Pt wishes to reschedule d/t corona virus. New appt 02/28/19

## 2019-01-12 ENCOUNTER — Ambulatory Visit: Payer: Medicare Other | Admitting: *Deleted

## 2019-02-27 NOTE — Progress Notes (Signed)
Virtual Visit via Video Note  I connected with patient on 02/28/19 at  8:00 AM EDT by a video enabled telemedicine application and verified that I am speaking with the correct person using two identifiers.   THIS ENCOUNTER IS A VIRTUAL VISIT DUE TO COVID-19 - PATIENT WAS NOT SEEN IN THE OFFICE. PATIENT HAS CONSENTED TO VIRTUAL VISIT / TELEMEDICINE VISIT   Location of patient: home  Location of provider: office  I discussed the limitations of evaluation and management by telemedicine and the availability of in person appointments. The patient expressed understanding and agreed to proceed.   Subjective:   Janice Meadows is a 68 y.o. female who presents for Medicare Annual (Subsequent) preventive examination.  Review of Systems: No ROS.  Medicare Wellness Virtual Visit. UTA vital signs.  Additional risk factors are reflected in the social history. Cardiac Risk Factors include: advanced age (>40men, >13 women);dyslipidemia;hypertension Sleep patterns: no issues Home Safety/Smoke Alarms: Feels safe in home. Smoke alarms in place.  Lives alone in 1 story home.    Female:   Pap-  hysterectomy     Mammo- 06/24/18      Dexa scan- 06/24/18 CCS- 08/06/17. Recall 3 years     Objective:     Vitals: Unable to assess. This visit is enabled though telemedicine due to Covid 19.   Advanced Directives 02/28/2019 08/06/2017 07/16/2017 05/12/2017 06/11/2015 12/26/2014  Does Patient Have a Medical Advance Directive? No No No No No No  Would patient like information on creating a medical advance directive? Yes (MAU/Ambulatory/Procedural Areas - Information given) - - Yes (MAU/Ambulatory/Procedural Areas - Information given) No - patient declined information No - patient declined information    Tobacco Social History   Tobacco Use  Smoking Status Current Every Day Smoker  . Packs/day: 0.50  . Years: 22.00  . Pack years: 11.00  Smokeless Tobacco Never Used  Tobacco Comment   5 cigarettes daily      Ready to quit: Not Answered Counseling given: Not Answered Comment: 5 cigarettes daily   Clinical Intake: Pain : No/denies pain   Past Medical History:  Diagnosis Date  . High cholesterol   . Hypertension   . Neuropathy    Past Surgical History:  Procedure Laterality Date  . ABDOMINAL HYSTERECTOMY    . BREAST CYST EXCISION     bilat  . TOOTH EXTRACTION  04/28/2017   Family History  Problem Relation Age of Onset  . Hypertension Mother   . Hypertension Father   . Cancer Sister 45       lymphoma   . Hypertension Sister   . Kidney disease Daughter   . Stroke Daughter   . Hypertension Sister   . Hypertension Sister   . Hypertension Brother   . Hypertension Brother   . Gout Brother   . Hypertension Brother   . Hypertension Brother   . Colon cancer Neg Hx    Social History   Socioeconomic History  . Marital status: Single    Spouse name: Not on file  . Number of children: Not on file  . Years of education: Not on file  . Highest education level: Not on file  Occupational History  . Not on file  Social Needs  . Financial resource strain: Not on file  . Food insecurity:    Worry: Not on file    Inability: Not on file  . Transportation needs:    Medical: Not on file    Non-medical: Not on file  Tobacco  Use  . Smoking status: Current Every Day Smoker    Packs/day: 0.50    Years: 22.00    Pack years: 11.00  . Smokeless tobacco: Never Used  . Tobacco comment: 5 cigarettes daily  Substance and Sexual Activity  . Alcohol use: Yes    Alcohol/week: 0.0 standard drinks    Comment: occasional  . Drug use: No  . Sexual activity: Never  Lifestyle  . Physical activity:    Days per week: Not on file    Minutes per session: Not on file  . Stress: Not on file  Relationships  . Social connections:    Talks on phone: Not on file    Gets together: Not on file    Attends religious service: Not on file    Active member of club or organization: Not on file     Attends meetings of clubs or organizations: Not on file    Relationship status: Not on file  Other Topics Concern  . Not on file  Social History Narrative   Works in a [employer]   7 children- one deceased shortly after birth   Lives alone but frequently has family visit   Has 7 grandchildren   Enjoys walking, playing grand kids, park   Dog- outdoor dog.      Outpatient Encounter Medications as of 02/28/2019  Medication Sig  . Calcium Carbonate-Vitamin D 600-400 MG-UNIT tablet Take 1 tablet by mouth 2 (two) times daily.  Marland Kitchen gabapentin (NEURONTIN) 300 MG capsule TAKE 1 CAPSULE(300 MG) BY MOUTH TWICE DAILY  . hydrochlorothiazide (HYDRODIURIL) 25 MG tablet TAKE 1 TABLET(25 MG) BY MOUTH DAILY  . loratadine (CLARITIN) 10 MG tablet Take 1 tablet (10 mg total) by mouth daily.  Marland Kitchen losartan (COZAAR) 50 MG tablet Take 1 tablet (50 mg total) by mouth daily.  . potassium chloride SA (K-DUR,KLOR-CON) 20 MEQ tablet TAKE 1 TABLET(20 MEQ) BY MOUTH DAILY  . simvastatin (ZOCOR) 40 MG tablet TAKE 1 TABLET BY MOUTH EVERY EVENING  . Multiple Vitamin (MULTIVITAMIN) tablet Take 1 tablet by mouth daily. One-a-day womens   No facility-administered encounter medications on file as of 02/28/2019.     Activities of Daily Living In your present state of health, do you have any difficulty performing the following activities: 02/28/2019  Hearing? N  Vision? N  Difficulty concentrating or making decisions? N  Walking or climbing stairs? N  Dressing or bathing? N  Doing errands, shopping? N  Preparing Food and eating ? N  Using the Toilet? N  In the past six months, have you accidently leaked urine? N  Do you have problems with loss of bowel control? N  Managing your Medications? N  Managing your Finances? N  Housekeeping or managing your Housekeeping? N  Some recent data might be hidden    Patient Care Team: Debbrah Alar, NP as PCP - General (Internal Medicine) Nahser, Wonda Cheng, MD as  PCP - Cardiology (Cardiology)    Assessment:   This is a routine wellness examination for Leaner. Physical assessment deferred to PCP.  Exercise Activities and Dietary recommendations Current Exercise Habits: The patient does not participate in regular exercise at present, Exercise limited by: None identified   Diet (meal preparation, eat out, water intake, caffeinated beverages, dairy products, fruits and vegetables): well balanced, on average, 2 meals per day     Goals    . Quit Smoking       Fall Risk Fall Risk  02/28/2019 06/22/2018 05/12/2017  09/07/2016 07/31/2016  Falls in the past year? 0 No No No No    Depression Screen PHQ 2/9 Scores 02/28/2019 06/22/2018 05/12/2017 09/07/2016  PHQ - 2 Score 0 0 0 0  PHQ- 9 Score - 0 - -     Cognitive Function Ad8 score reviewed for issues:  Issues making decisions:no  Less interest in hobbies / activities:no  Repeats questions, stories (family complaining):no  Trouble using ordinary gadgets (microwave, computer, phone):no  Forgets the month or year: no  Mismanaging finances: no  Remembering appts:no  Daily problems with thinking and/or memory:no Ad8 score is=0         Immunization History  Administered Date(s) Administered  . Influenza, High Dose Seasonal PF 09/14/2017, 08/12/2018  . Influenza,inj,Quad PF,6+ Mos 09/09/2015, 07/31/2016  . Pneumococcal Conjugate-13 02/10/2017  . Pneumococcal Polysaccharide-23 10/26/2012, 06/22/2018  . Tdap 03/04/2015   Screening Tests Health Maintenance  Topic Date Due  . INFLUENZA VACCINE  05/27/2019  . MAMMOGRAM  06/24/2020  . COLONOSCOPY  08/06/2020  . TETANUS/TDAP  03/03/2025  . DEXA SCAN  Completed  . Hepatitis C Screening  Completed  . PNA vac Low Risk Adult  Completed      Plan:   See you next year!  Continue to eat heart healthy diet (full of fruits, vegetables, whole grains, lean protein, water--limit salt, fat, and sugar intake) and increase physical activity as  tolerated.  Continue doing brain stimulating activities (puzzles, reading, adult coloring books, staying active) to keep memory sharp.   Bring a copy of your living will and/or healthcare power of attorney to your next office visit.   I have personally reviewed and noted the following in the patient's chart:   . Medical and social history . Use of alcohol, tobacco or illicit drugs  . Current medications and supplements . Functional ability and status . Nutritional status . Physical activity . Advanced directives . List of other physicians . Hospitalizations, surgeries, and ER visits in previous 12 months . Vitals . Screenings to include cognitive, depression, and falls . Referrals and appointments  In addition, I have reviewed and discussed with patient certain preventive protocols, quality metrics, and best practice recommendations. A written personalized care plan for preventive services as well as general preventive health recommendations were provided to patient.     Shela Nevin, [place]  02/28/2019

## 2019-02-28 ENCOUNTER — Other Ambulatory Visit: Payer: Self-pay

## 2019-02-28 ENCOUNTER — Encounter: Payer: Self-pay | Admitting: *Deleted

## 2019-02-28 ENCOUNTER — Ambulatory Visit (INDEPENDENT_AMBULATORY_CARE_PROVIDER_SITE_OTHER): Payer: Medicare Other | Admitting: *Deleted

## 2019-02-28 DIAGNOSIS — Z0000 Encounter for general adult medical examination without abnormal findings: Secondary | ICD-10-CM

## 2019-02-28 NOTE — Progress Notes (Signed)
RN note reviewed and agree.  Greene Diodato S O'Sullivan NP 

## 2019-02-28 NOTE — Patient Instructions (Signed)
See you next year!  Continue to eat heart healthy diet (full of fruits, vegetables, whole grains, lean protein, water--limit salt, fat, and sugar intake) and increase physical activity as tolerated.  Continue doing brain stimulating activities (puzzles, reading, adult coloring books, staying active) to keep memory sharp.   Bring a copy of your living will and/or healthcare power of attorney to your next office visit.   Janice Meadows , Thank you for taking time to come for your Medicare Wellness Visit. I appreciate your ongoing commitment to your health goals. Please review the following plan we discussed and let me know if I can assist you in the future.   These are the goals we discussed: Goals    . Quit Smoking       This is a list of the screening recommended for you and due dates:  Health Maintenance  Topic Date Due  . Flu Shot  05/27/2019  . Mammogram  06/24/2020  . Colon Cancer Screening  08/06/2020  . Tetanus Vaccine  03/03/2025  . DEXA scan (bone density measurement)  Completed  .  Hepatitis C: One time screening is recommended by Center for Disease Control  (CDC) for  adults born from [birth year] through 1964.   Completed  . Pneumonia vaccines  Completed    Health Maintenance After Age 93 After age 36, you are at a higher risk for certain long-term diseases and infections as well as injuries from falls. Falls are a major cause of broken bones and head injuries in people who are older than age 65. Getting regular preventive care can help to keep you healthy and well. Preventive care includes getting regular testing and making lifestyle changes as recommended by your health care provider. Talk with your health care provider about:  Which screenings and tests you should have. A screening is a test that checks for a disease when you have no symptoms.  A diet and exercise plan that is right for you. What should I know about screenings and tests to prevent falls? Screening and testing  are the best ways to find a health problem early. Early diagnosis and treatment give you the best chance of managing medical conditions that are common after age 59. Certain conditions and lifestyle choices may make you more likely to have a fall. Your health care provider may recommend:  Regular vision checks. Poor vision and conditions such as cataracts can make you more likely to have a fall. If you wear glasses, make sure to get your prescription updated if your vision changes.  Medicine review. Work with your health care provider to regularly review all of the medicines you are taking, including over-the-counter medicines. Ask your health care provider about any side effects that may make you more likely to have a fall. Tell your health care provider if any medicines that you take make you feel dizzy or sleepy.  Osteoporosis screening. Osteoporosis is a condition that causes the bones to get weaker. This can make the bones weak and cause them to break more easily.  Blood pressure screening. Blood pressure changes and medicines to control blood pressure can make you feel dizzy.  Strength and balance checks. Your health care provider may recommend certain tests to check your strength and balance while standing, walking, or changing positions.  Foot health exam. Foot pain and numbness, as well as not wearing proper footwear, can make you more likely to have a fall.  Depression screening. You may be more likely to have a  fall if you have a fear of falling, feel emotionally low, or feel unable to do activities that you used to do.  Alcohol use screening. Using too much alcohol can affect your balance and may make you more likely to have a fall. What actions can I take to lower my risk of falls? General instructions  Talk with your health care provider about your risks for falling. Tell your health care provider if: ? You fall. Be sure to tell your health care provider about all falls, even ones  that seem minor. ? You feel dizzy, sleepy, or off-balance.  Take over-the-counter and prescription medicines only as told by your health care provider. These include any supplements.  Eat a healthy diet and maintain a healthy weight. A healthy diet includes low-fat dairy products, low-fat (lean) meats, and fiber from whole grains, beans, and lots of fruits and vegetables. Home safety  Remove any tripping hazards, such as rugs, cords, and clutter.  Install safety equipment such as grab bars in bathrooms and safety rails on stairs.  Keep rooms and walkways well-lit. Activity   Follow a regular exercise program to stay fit. This will help you maintain your balance. Ask your health care provider what types of exercise are appropriate for you.  If you need a cane or walker, use it as recommended by your health care provider.  Wear supportive shoes that have nonskid soles. Lifestyle  Do not drink alcohol if your health care provider tells you not to drink.  If you drink alcohol, limit how much you have: ? 0-1 drink a day for women. ? 0-2 drinks a day for men.  Be aware of how much alcohol is in your drink. In the [place] one drink equals one typical bottle of beer (12 oz), one-half glass of wine (5 oz), or one shot of hard liquor (1 oz).  Do not use any products that contain nicotine or tobacco, such as cigarettes and e-cigarettes. If you need help quitting, ask your health care provider. Summary  Having a healthy lifestyle and getting preventive care can help to protect your health and wellness after age 85.  Screening and testing are the best way to find a health problem early and help you avoid having a fall. Early diagnosis and treatment give you the best chance for managing medical conditions that are more common for people who are older than age 56.  Falls are a major cause of broken bones and head injuries in people who are older than age 18. Take precautions to prevent a fall  at home.  Work with your health care provider to learn what changes you can make to improve your health and wellness and to prevent falls. This information is not intended to replace advice given to you by your health care provider. Make sure you discuss any questions you have with your health care provider. Document Released: 08/25/2017 Document Revised: 08/25/2017 Document Reviewed: 08/25/2017 Elsevier Interactive Patient Education  2019 [employer].

## 2019-03-29 ENCOUNTER — Other Ambulatory Visit: Payer: Self-pay | Admitting: Family

## 2019-03-30 ENCOUNTER — Telehealth: Payer: Self-pay | Admitting: Family

## 2019-03-31 ENCOUNTER — Other Ambulatory Visit: Payer: Self-pay | Admitting: Family

## 2019-03-31 NOTE — Telephone Encounter (Signed)
I sent a 30 day supply of her medications. Please contact patient to schedule a virtual visit.

## 2019-04-04 ENCOUNTER — Other Ambulatory Visit: Payer: Self-pay

## 2019-04-04 ENCOUNTER — Ambulatory Visit (INDEPENDENT_AMBULATORY_CARE_PROVIDER_SITE_OTHER): Payer: Medicare Other | Admitting: Family

## 2019-04-04 DIAGNOSIS — I10 Essential (primary) hypertension: Secondary | ICD-10-CM

## 2019-04-04 DIAGNOSIS — Z720 Tobacco use: Secondary | ICD-10-CM | POA: Diagnosis not present

## 2019-04-04 DIAGNOSIS — G629 Polyneuropathy, unspecified: Secondary | ICD-10-CM | POA: Diagnosis not present

## 2019-04-04 DIAGNOSIS — E785 Hyperlipidemia, unspecified: Secondary | ICD-10-CM

## 2019-04-04 MED ORDER — HYDROCHLOROTHIAZIDE 25 MG PO TABS: ORAL_TABLET | ORAL | 1 refills | Status: DC

## 2019-04-04 MED ORDER — LOSARTAN POTASSIUM 50 MG PO TABS: 50.0000 mg | ORAL_TABLET | Freq: Every day | ORAL | 1 refills | Status: DC

## 2019-04-04 MED ORDER — SIMVASTATIN 40 MG PO TABS: 40.0000 mg | ORAL_TABLET | Freq: Every evening | ORAL | 1 refills | Status: DC

## 2019-04-04 MED ORDER — POTASSIUM CHLORIDE CRYS ER 20 MEQ PO TBCR: 20.0000 meq | EXTENDED_RELEASE_TABLET | Freq: Every day | ORAL | 1 refills | Status: DC

## 2019-04-04 MED ORDER — GABAPENTIN 300 MG PO CAPS: 300.0000 mg | ORAL_CAPSULE | Freq: Two times a day (BID) | ORAL | 0 refills | Status: DC

## 2019-04-04 NOTE — Progress Notes (Signed)
Virtual Visit via Video Note  I connected with Janice Meadows on 04/04/19 at  3:00 PM EDT by a video enabled telemedicine application and verified that I am speaking with the correct person using two identifiers. This visit type was conducted due to national recommendations for restrictions regarding the COVID-19 Pandemic (e.g. social distancing).  This format is felt to be most appropriate for this patient at this time.   I discussed the limitations of evaluation and management by telemedicine and the availability of in person appointments. The patient expressed understanding and agreed to proceed.  Only the patient and myself were on today's video visit. The patient was at home and I was in my office at the time of today's visit.   History of Present Illness:   Patient is a 68 yr old female who presents today for routine follow up.   Hyperlipidemia- reports good compliance with simvastatin, denies myalgia.  Lab Results  Component Value Date   CHOL 159 06/22/2018   HDL 65.10 06/22/2018   LDLCALC 80 06/22/2018   TRIG 69.0 06/22/2018   CHOLHDL 2 06/22/2018   HTN- reports that she has not checked bp recently but reports that when she checks bp is 140/70.   BP Readings from Last 3 Encounters:  06/22/18 126/77  12/31/17 122/84  11/12/17 124/76   Neuropathy-  Reports leg pain is improved. Using gabapentin bid.   Tobacco abuse- reports 7-8 cigarettes a day.  Reports that she has never used the patch or the gum.  She reports that she is motivated to quit.  Past Medical History:  Diagnosis Date  . High cholesterol   . Hypertension   . Neuropathy      Social History   Socioeconomic History  . Marital status: Single    Spouse name: Not on file  . Number of children: Not on file  . Years of education: Not on file  . Highest education level: Not on file  Occupational History  . Not on file  Social Needs  . Financial resource strain: Not on file  . Food insecurity:    Worry: Not  on file    Inability: Not on file  . Transportation needs:    Medical: Not on file    Non-medical: Not on file  Tobacco Use  . Smoking status: Current Every Day Smoker    Packs/day: 0.50    Years: 22.00    Pack years: 11.00  . Smokeless tobacco: Never Used  . Tobacco comment: 5 cigarettes daily  Substance and Sexual Activity  . Alcohol use: Yes    Alcohol/week: 0.0 standard drinks    Comment: occasional  . Drug use: No  . Sexual activity: Never  Lifestyle  . Physical activity:    Days per week: Not on file    Minutes per session: Not on file  . Stress: Not on file  Relationships  . Social connections:    Talks on phone: Not on file    Gets together: Not on file    Attends religious service: Not on file    Active member of club or organization: Not on file    Attends meetings of clubs or organizations: Not on file    Relationship status: Not on file  . Intimate partner violence:    Fear of current or ex partner: Not on file    Emotionally abused: Not on file    Physically abused: Not on file    Forced sexual activity: Not on file  Other Topics Concern  . Not on file  Social History Narrative   Works in a [place]   7 children- one deceased shortly after birth   Lives alone but frequently has family visit   Has 7 grandchildren   Enjoys walking, playing grand kids, park   Dog- outdoor dog.      Past Surgical History:  Procedure Laterality Date  . ABDOMINAL HYSTERECTOMY    . BREAST CYST EXCISION     bilat  . TOOTH EXTRACTION  04/28/2017    Family History  Problem Relation Age of Onset  . Hypertension Mother   . Hypertension Father   . Cancer Sister 70       lymphoma   . Hypertension Sister   . Kidney disease Daughter   . Stroke Daughter   . Hypertension Sister   . Hypertension Sister   . Hypertension Brother   . Hypertension Brother   . Gout Brother   . Hypertension Brother   . Hypertension Brother   . Colon cancer Neg Hx     No  Known Allergies  Current Outpatient Medications on File Prior to Visit  Medication Sig Dispense Refill  . Calcium Carbonate-Vitamin D 600-400 MG-UNIT tablet Take 1 tablet by mouth 2 (two) times daily.    Marland Kitchen loratadine (CLARITIN) 10 MG tablet Take 1 tablet (10 mg total) by mouth daily. 30 tablet 11  . Multiple Vitamin (MULTIVITAMIN) tablet Take 1 tablet by mouth daily. One-a-day womens     No current facility-administered medications on file prior to visit.     There were no vitals taken for this visit.   Observations/Objective:   Gen: Awake, alert, no acute distress Resp: Breathing is even and non-labored Psych: calm/pleasant demeanor Neuro: Alert and Oriented x 3, + facial symmetry, speech is clear.  Assessment and Plan:  Tobacco abuse-patient was counseled on smoking cessation for 3 to 5 minutes today.  We discussed the nicotine patch.  I suggested that she begin at the 14 mcg patch once daily and remove while sleeping.  Continue for 30 days and then if successful decrease to the 7 mcg patch for 30 days then DC patch altogether.  Hypertension- stable per patient report.  Continue current medication.  Will arrange lab draw since she has not had labs drawn since last summer.  Hyperlipidemia- tolerating statin.  Will check follow-up lipid panel. Peripheral neuropathy-reports symptoms are stable on gabapentin.  Continue same.  Follow Up Instructions:    I discussed the assessment and treatment plan with the patient. The patient was provided an opportunity to ask questions and all were answered. The patient agreed with the plan and demonstrated an understanding of the instructions.   The patient was advised to call back or seek an in-person evaluation if the symptoms worsen or if the condition fails to improve as anticipated.    Nance Pear, NP

## 2019-07-06 ENCOUNTER — Other Ambulatory Visit: Payer: Self-pay

## 2019-07-06 DIAGNOSIS — Z20822 Contact with and (suspected) exposure to covid-19: Secondary | ICD-10-CM

## 2019-07-07 LAB — NOVEL CORONAVIRUS, NAA: SARS-CoV-2, NAA: NOT DETECTED

## 2019-07-17 ENCOUNTER — Other Ambulatory Visit (HOSPITAL_BASED_OUTPATIENT_CLINIC_OR_DEPARTMENT_OTHER): Payer: Self-pay | Admitting: Family

## 2019-07-17 DIAGNOSIS — Z1231 Encounter for screening mammogram for malignant neoplasm of breast: Secondary | ICD-10-CM

## 2019-07-18 ENCOUNTER — Telehealth: Payer: Self-pay | Admitting: *Deleted

## 2019-07-18 NOTE — Telephone Encounter (Signed)
No, that looks good. Tks.

## 2019-07-18 NOTE — Telephone Encounter (Signed)
Patient was due for 3 months follow up, appointment was changed to ov follow up with [facility], will get labs same day.

## 2019-07-18 NOTE — Telephone Encounter (Signed)
Copied from [name]. Topic: General - Inquiry >> Jul 17, 2019  4:11 PM Janice Meadows, [place] wrote: Reason for CRM: Patient called in and scheduled for labs 9/29. Patient wanting to include fasting labs, please advise.

## 2019-07-18 NOTE — Telephone Encounter (Signed)
Patient scheduled for a CMET and a lipid panel.  Does she need anything else?

## 2019-07-19 ENCOUNTER — Other Ambulatory Visit: Payer: Self-pay

## 2019-07-19 ENCOUNTER — Ambulatory Visit (HOSPITAL_BASED_OUTPATIENT_CLINIC_OR_DEPARTMENT_OTHER)
Admission: RE | Admit: 2019-07-19 | Discharge: 2019-07-19 | Disposition: A | Discharge status: Home / Self Care | Payer: Medicare Other | Source: Ambulatory Visit | Attending: Family | Admitting: Family

## 2019-07-19 DIAGNOSIS — Z1231 Encounter for screening mammogram for malignant neoplasm of breast: Secondary | ICD-10-CM | POA: Diagnosis not present

## 2019-07-24 ENCOUNTER — Other Ambulatory Visit: Payer: Self-pay

## 2019-07-25 ENCOUNTER — Ambulatory Visit (INDEPENDENT_AMBULATORY_CARE_PROVIDER_SITE_OTHER): Payer: Medicare Other | Admitting: Family

## 2019-07-25 ENCOUNTER — Encounter: Payer: Self-pay | Admitting: Family

## 2019-07-25 ENCOUNTER — Other Ambulatory Visit: Payer: Self-pay

## 2019-07-25 ENCOUNTER — Other Ambulatory Visit: Payer: Medicare Other

## 2019-07-25 VITALS — BP 132/80 | HR 77 | Temp 96.8°F | Resp 16 | Ht 62.25 in | Wt 191.0 lb

## 2019-07-25 DIAGNOSIS — Z720 Tobacco use: Secondary | ICD-10-CM | POA: Diagnosis not present

## 2019-07-25 DIAGNOSIS — Z23 Encounter for immunization: Secondary | ICD-10-CM

## 2019-07-25 DIAGNOSIS — I10 Essential (primary) hypertension: Secondary | ICD-10-CM | POA: Diagnosis not present

## 2019-07-25 DIAGNOSIS — G629 Polyneuropathy, unspecified: Secondary | ICD-10-CM

## 2019-07-25 DIAGNOSIS — E785 Hyperlipidemia, unspecified: Secondary | ICD-10-CM

## 2019-07-25 LAB — LIPID PANEL
Cholesterol: 172 mg/dL (ref 0–200)
HDL: 76.1 mg/dL (ref >39.00)
LDL Cholesterol: 84 mg/dL (ref 0–99)
NonHDL: 95.46
Total CHOL/HDL Ratio: 2
Triglycerides: 55 mg/dL (ref 0.0–149.0)
VLDL: 11 mg/dL (ref 0.0–40.0)

## 2019-07-25 LAB — COMPREHENSIVE METABOLIC PANEL
ALT: 8 U/L (ref 0–35)
AST: 18 U/L (ref 0–37)
Albumin: 3.6 g/dL (ref 3.5–5.2)
Alkaline Phosphatase: 105 U/L (ref 39–117)
BUN: 16 mg/dL (ref 6–23)
CO2: 26 mEq/L (ref 19–32)
Calcium: 9 mg/dL (ref 8.4–10.5)
Chloride: 105 mEq/L (ref 96–112)
Creatinine, Ser: 1.03 mg/dL (ref 0.40–1.20)
GFR: 64.53 mL/min (ref >60.00)
Glucose, Bld: 88 mg/dL (ref 70–99)
Potassium: 3.9 mEq/L (ref 3.5–5.1)
Sodium: 142 mEq/L (ref 135–145)
Total Bilirubin: 0.4 mg/dL (ref 0.2–1.2)
Total Protein: 7 g/dL (ref 6.0–8.3)

## 2019-07-25 NOTE — Progress Notes (Signed)
Subjective:    Patient ID: Janice Meadows, female    DOB: [birth year], 68 y.o.   MRN: [id number]  HPI   Patient is a 68 yr old female who presents today for follow up:  Hyperlipidemia- maintained on simvastatin. Lab Results  Component Value Date   CHOL 159 06/22/2018   HDL 65.10 06/22/2018   LDLCALC 80 06/22/2018   TRIG 69.0 06/22/2018   CHOLHDL 2 06/22/2018   HTN- reports good compliance with hctz, losartan. Reports some LE edema at the end of the day which resolves with elevation.    BP Readings from Last 3 Encounters:  07/25/19 132/80  06/22/18 126/77  12/31/17 122/84   Neuropathy- maintained on gabapentin. Reports that this helps her.   Tobacco abuse- last visit we discussed smoking cessation and trial of the nicotine patch. Reports that she down to 4 cigarettes a day. Never started the patch "there was just so much going on."       Review of Systems See HPI  Past Medical History:  Diagnosis Date  . High cholesterol   . Hypertension   . Neuropathy      Social History   Socioeconomic History  . Marital status: Single    Spouse name: Not on file  . Number of children: Not on file  . Years of education: Not on file  . Highest education level: Not on file  Occupational History  . Not on file  Social Needs  . Financial resource strain: Not on file  . Food insecurity    Worry: Not on file    Inability: Not on file  . Transportation needs    Medical: Not on file    Non-medical: Not on file  Tobacco Use  . Smoking status: Current Every Day Smoker    Packs/day: 0.50    Years: 22.00    Pack years: 11.00  . Smokeless tobacco: Never Used  . Tobacco comment: 5 cigarettes daily  Substance and Sexual Activity  . Alcohol use: Yes    Alcohol/week: 0.0 standard drinks    Comment: occasional  . Drug use: No  . Sexual activity: Never  Lifestyle  . Physical activity    Days per week: Not on file    Minutes per session: Not on file  . Stress: Not on file   Relationships  . Social Herbalist on phone: Not on file    Gets together: Not on file    Attends religious service: Not on file    Active member of club or organization: Not on file    Attends meetings of clubs or organizations: Not on file    Relationship status: Not on file  . Intimate partner violence    Fear of current or ex partner: Not on file    Emotionally abused: Not on file    Physically abused: Not on file    Forced sexual activity: Not on file  Other Topics Concern  . Not on file  Social History Narrative   Works in a [place]   7 children- one deceased shortly after birth   Lives alone but frequently has family visit   Has 7 grandchildren   Enjoys walking, playing grand kids, park   Dog- outdoor dog.      Past Surgical History:  Procedure Laterality Date  . ABDOMINAL HYSTERECTOMY    . BREAST CYST EXCISION     bilat  . TOOTH EXTRACTION  04/28/2017  Family History  Problem Relation Age of Onset  . Hypertension Mother   . Hypertension Father   . Cancer Sister 32       lymphoma   . Hypertension Sister   . Kidney disease Daughter   . Stroke Daughter   . Hypertension Sister   . Hypertension Sister   . Hypertension Brother   . Hypertension Brother   . Gout Brother   . Hypertension Brother   . Hypertension Brother   . Colon cancer Neg Hx     No Known Allergies  Current Outpatient Medications on File Prior to Visit  Medication Sig Dispense Refill  . Calcium Carbonate-Vitamin D 600-400 MG-UNIT tablet Take 1 tablet by mouth 2 (two) times daily.    Marland Kitchen gabapentin (NEURONTIN) 300 MG capsule Take 1 capsule (300 mg total) by mouth 2 (two) times daily. TAKE 1 CAPSULE(300 MG) BY MOUTH TWICE DAILY 180 capsule 0  . hydrochlorothiazide (HYDRODIURIL) 25 MG tablet TAKE 1 TABLET BY MOUTH EVERY DAY 90 tablet 1  . loratadine (CLARITIN) 10 MG tablet Take 1 tablet (10 mg total) by mouth daily. 30 tablet 11  . losartan (COZAAR) 50 MG tablet  Take 1 tablet (50 mg total) by mouth daily. 90 tablet 1  . Multiple Vitamin (MULTIVITAMIN) tablet Take 1 tablet by mouth daily. One-a-day womens    . potassium chloride SA (K-DUR) 20 MEQ tablet Take 1 tablet (20 mEq total) by mouth daily. 90 tablet 1  . simvastatin (ZOCOR) 40 MG tablet Take 1 tablet (40 mg total) by mouth every evening. 90 tablet 1   No current facility-administered medications on file prior to visit.     BP 132/80 (BP Location: Right Arm, Patient Position: Sitting, Cuff Size: Normal)   Pulse 77   Temp (!) 96.8 F (36 C) (Temporal)   Resp 16   Ht 5' 2.25" (1.581 m)   Wt 191 lb (86.6 kg)   SpO2 98%   BMI 34.65 kg/m       Objective:   Physical Exam Constitutional:      Appearance: She is well-developed.  Neck:     Musculoskeletal: Neck supple.     Thyroid: No thyromegaly.  Cardiovascular:     Rate and Rhythm: Normal rate and regular rhythm.     Heart sounds: Normal heart sounds. No murmur.  Pulmonary:     Effort: Pulmonary effort is normal. No respiratory distress.     Breath sounds: Normal breath sounds. No wheezing.  Musculoskeletal:     Right lower leg: 2+ Edema present.     Left lower leg: 2+ Edema present.  Skin:    General: Skin is warm and dry.  Neurological:     Mental Status: She is alert and oriented to person, place, and time.  Psychiatric:        Behavior: Behavior normal.        Thought Content: Thought content normal.        Judgment: Judgment normal.           Assessment & Plan:  HTN- bp stable on current meds, continue same. Obtain follow up CMET.  Hyperlipidemia- maintained on statin, continue same. Obtain follow up lipid panel.  Neuropathy- symptoms controlled well with gabapentin. Continue same.  Tobacco abuse- she has continued to cut back and is now down to 4 cigarettes a day. Advised pt to try using nicorette gum or lozenges in place of cigarettes to help her quit.

## 2019-07-25 NOTE — Addendum Note (Signed)
Addended by: Wynonia Musty A on: 07/25/2019 07:54 AM   Modules accepted: Orders

## 2019-07-25 NOTE — Patient Instructions (Signed)
Please try replacing your 4 cigarettes a day with nicorette gum or lozenges to curb cravings.

## 2019-07-26 NOTE — Progress Notes (Signed)
Mailed out to pt 

## 2019-08-08 ENCOUNTER — Other Ambulatory Visit: Payer: Self-pay

## 2019-08-08 NOTE — Patient Outreach (Signed)
[facility]) Care Management  08/08/2019  Janice Meadows 07-28-1951 [id number]   Medication Adherence call to Janice Meadows HIPPA Compliant Voice message left with a call back number. Janice Meadows is showing past due on Simvastatin 40 mg under [place].   [facility] Direct Dial [phone]  Fax [phone] [email or web address]

## 2019-12-28 ENCOUNTER — Other Ambulatory Visit: Payer: Self-pay

## 2019-12-28 MED ORDER — SIMVASTATIN 40 MG PO TABS: 40.0000 mg | ORAL_TABLET | Freq: Every evening | ORAL | 0 refills | Status: DC

## 2020-02-29 ENCOUNTER — Ambulatory Visit: Payer: Medicare Other | Admitting: *Deleted

## 2020-03-18 DIAGNOSIS — M1711 Unilateral primary osteoarthritis, right knee: Secondary | ICD-10-CM | POA: Diagnosis not present

## 2020-04-05 ENCOUNTER — Other Ambulatory Visit: Payer: Self-pay | Admitting: *Deleted

## 2020-04-05 ENCOUNTER — Telehealth: Payer: Self-pay | Admitting: Family

## 2020-04-05 MED ORDER — HYDROCHLOROTHIAZIDE 25 MG PO TABS: ORAL_TABLET | ORAL | 0 refills | Status: DC

## 2020-04-05 MED ORDER — SIMVASTATIN 40 MG PO TABS: 40.0000 mg | ORAL_TABLET | Freq: Every evening | ORAL | 0 refills | Status: DC

## 2020-04-05 NOTE — Telephone Encounter (Signed)
rx sent to pharmacy for 30 days

## 2020-04-05 NOTE — Telephone Encounter (Signed)
Medication: simvastatin (ZOCOR) 40 MG tablet   Has the patient contacted their pharmacy? No. (If no, request that the patient contact the pharmacy for the refill.) (If yes, when and what did the pharmacy advise?)  Preferred Pharmacy (with phone number or street name): [facility] [address], [place] [address]  Phone:  [phone] Fax:  [phone]  DEA #:  [id number]  Agent: Please be advised that RX refills may take up to 3 business days. We ask that you follow-up with your pharmacy.

## 2020-04-05 NOTE — Telephone Encounter (Signed)
Schedulers -- please call pt to schedule OV with Melissa before further refills are needed. Thanks!  Received request from [facility] for 90 day supply of HCTZ. Pt was due for follow up in March and is past due. 30 day supply sent.

## 2020-04-16 ENCOUNTER — Other Ambulatory Visit: Payer: Self-pay

## 2020-04-16 ENCOUNTER — Ambulatory Visit (HOSPITAL_BASED_OUTPATIENT_CLINIC_OR_DEPARTMENT_OTHER)
Admission: RE | Admit: 2020-04-16 | Discharge: 2020-04-16 | Disposition: A | Discharge status: Home / Self Care | Payer: Medicare Other | Source: Ambulatory Visit | Attending: Family | Admitting: Family

## 2020-04-16 ENCOUNTER — Encounter: Payer: Self-pay | Admitting: Family

## 2020-04-16 ENCOUNTER — Ambulatory Visit (INDEPENDENT_AMBULATORY_CARE_PROVIDER_SITE_OTHER): Payer: Medicare Other | Admitting: Family

## 2020-04-16 VITALS — BP 148/88 | HR 66 | Temp 97.5°F | Resp 16 | Ht 62.25 in | Wt 183.0 lb

## 2020-04-16 DIAGNOSIS — I10 Essential (primary) hypertension: Secondary | ICD-10-CM | POA: Diagnosis not present

## 2020-04-16 DIAGNOSIS — R0989 Other specified symptoms and signs involving the circulatory and respiratory systems: Secondary | ICD-10-CM | POA: Diagnosis not present

## 2020-04-16 DIAGNOSIS — R918 Other nonspecific abnormal finding of lung field: Secondary | ICD-10-CM

## 2020-04-16 DIAGNOSIS — E1169 Type 2 diabetes mellitus with other specified complication: Secondary | ICD-10-CM | POA: Diagnosis not present

## 2020-04-16 DIAGNOSIS — E785 Hyperlipidemia, unspecified: Secondary | ICD-10-CM

## 2020-04-16 DIAGNOSIS — G629 Polyneuropathy, unspecified: Secondary | ICD-10-CM

## 2020-04-16 LAB — COMPREHENSIVE METABOLIC PANEL
ALT: 11 U/L (ref 0–35)
AST: 21 U/L (ref 0–37)
Albumin: 3.5 g/dL (ref 3.5–5.2)
Alkaline Phosphatase: 83 U/L (ref 39–117)
BUN: 13 mg/dL (ref 6–23)
CO2: 31 mEq/L (ref 19–32)
Calcium: 9.1 mg/dL (ref 8.4–10.5)
Chloride: 105 mEq/L (ref 96–112)
Creatinine, Ser: 0.87 mg/dL (ref 0.40–1.20)
GFR: 78.24 mL/min (ref >60.00)
Glucose, Bld: 88 mg/dL (ref 70–99)
Potassium: 4.3 mEq/L (ref 3.5–5.1)
Sodium: 142 mEq/L (ref 135–145)
Total Bilirubin: 0.4 mg/dL (ref 0.2–1.2)
Total Protein: 6.4 g/dL (ref 6.0–8.3)

## 2020-04-16 LAB — LIPID PANEL
Cholesterol: 158 mg/dL (ref 0–200)
HDL: 66.9 mg/dL (ref >39.00)
LDL Cholesterol: 81 mg/dL (ref 0–99)
NonHDL: 90.79
Total CHOL/HDL Ratio: 2
Triglycerides: 47 mg/dL (ref 0.0–149.0)
VLDL: 9.4 mg/dL (ref 0.0–40.0)

## 2020-04-16 MED ORDER — SIMVASTATIN 40 MG PO TABS: 40.0000 mg | ORAL_TABLET | Freq: Every evening | ORAL | 1 refills | Status: DC

## 2020-04-16 MED ORDER — LOSARTAN POTASSIUM 50 MG PO TABS: 50.0000 mg | ORAL_TABLET | Freq: Every day | ORAL | 1 refills | Status: DC

## 2020-04-16 MED ORDER — GABAPENTIN 300 MG PO CAPS: 300.0000 mg | ORAL_CAPSULE | Freq: Every day | ORAL | 0 refills | Status: DC

## 2020-04-16 MED ORDER — POTASSIUM CHLORIDE CRYS ER 20 MEQ PO TBCR: 20.0000 meq | EXTENDED_RELEASE_TABLET | Freq: Every day | ORAL | 1 refills | Status: DC

## 2020-04-16 MED ORDER — HYDROCHLOROTHIAZIDE 25 MG PO TABS: ORAL_TABLET | ORAL | 1 refills | Status: DC

## 2020-04-16 NOTE — Patient Instructions (Signed)
Please complete lab work prior to leaving. Complete chest x-ray on the first floor.  Please work on quitting smoking.

## 2020-04-16 NOTE — Progress Notes (Signed)
Subjective:    Patient ID: Janice Meadows, female    DOB: July 08, 1951, 69 y.o.   MRN: [id number]  HPI   Patient is a 69 yr old female who presents today for follow up.  HTN- BP meds include hctz 25mg , losartan 50mg .   BP Readings from Last 3 Encounters:  04/16/20 (!) 148/88  07/25/19 132/80  06/22/18 126/77   Hyperlipidemia- maintained on simvastatin.  Lab Results  Component Value Date   CHOL 172 07/25/2019   HDL 76.10 07/25/2019   LDLCALC 84 07/25/2019   TRIG 55.0 07/25/2019   CHOLHDL 2 07/25/2019   Peripheral neuropathy- maintained on gabapentin 300mg  PO QHS. Reports that this helps considerably.   Tobacco abuse-she reports that prior to Covid she was down to about 2 cigarettes a day.  However during quarantine she started smoking more frequently.  She is currently smoking about 7 cigarettes a day.  Review of Systems See HPI  Past Medical History:  Diagnosis Date  . High cholesterol   . Hypertension   . Neuropathy      Social History   Socioeconomic History  . Marital status: Single    Spouse name: Not on file  . Number of children: Not on file  . Years of education: Not on file  . Highest education level: Not on file  Occupational History  . Not on file  Tobacco Use  . Smoking status: Current Every Day Smoker    Packs/day: 0.50    Years: 22.00    Pack years: 11.00  . Smokeless tobacco: Never Used  . Tobacco comment: 5 cigarettes daily  Vaping Use  . Vaping Use: Never used  Substance and Sexual Activity  . Alcohol use: Yes    Alcohol/week: 0.0 standard drinks    Comment: occasional  . Drug use: No  . Sexual activity: Never  Other Topics Concern  . Not on file  Social History Narrative   Works in a [place]   7 children- one deceased shortly after birth   Lives alone but frequently has family visit   Has 7 grandchildren   Enjoys walking, playing grand kids, park   Dog- outdoor dog.     Social Determinants of Health   Financial  Resource Strain:   . Difficulty of Paying Living Expenses:   Food Insecurity:   . Worried About Charity fundraiser in the Last Year:   . Arboriculturist in the Last Year:   Transportation Needs:   . Film/video editor (Medical):   Marland Kitchen Lack of Transportation (Non-Medical):   Physical Activity:   . Days of Exercise per Week:   . Minutes of Exercise per Session:   Stress:   . Feeling of Stress :   Social Connections:   . Frequency of Communication with Friends and Family:   . Frequency of Social Gatherings with Friends and Family:   . Attends Religious Services:   . Active Member of Clubs or Organizations:   . Attends Archivist Meetings:   Marland Kitchen Marital Status:   Intimate Partner Violence:   . Fear of Current or Ex-Partner:   . Emotionally Abused:   Marland Kitchen Physically Abused:   . Sexually Abused:     Past Surgical History:  Procedure Laterality Date  . ABDOMINAL HYSTERECTOMY    . BREAST CYST EXCISION     bilat  . TOOTH EXTRACTION  04/28/2017    Family History  Problem Relation Age of Onset  .  Hypertension Mother   . Hypertension Father   . Cancer Sister 24       lymphoma   . Hypertension Sister   . Kidney disease Daughter   . Stroke Daughter   . Hypertension Sister   . Hypertension Sister   . Hypertension Brother   . Hypertension Brother   . Gout Brother   . Hypertension Brother   . Hypertension Brother   . Colon cancer Neg Hx     No Known Allergies  Current Outpatient Medications on File Prior to Visit  Medication Sig Dispense Refill  . Calcium Carbonate-Vitamin D 600-400 MG-UNIT tablet Take 1 tablet by mouth 2 (two) times daily.    Marland Kitchen loratadine (CLARITIN) 10 MG tablet Take 1 tablet (10 mg total) by mouth daily. 30 tablet 11  . Multiple Vitamin (MULTIVITAMIN) tablet Take 1 tablet by mouth daily. One-a-day womens     No current facility-administered medications on file prior to visit.    BP (!) 148/88   Pulse 66   Temp (!) 97.5 F (36.4 C)  (Temporal)   Resp 16   Ht 5' 2.25" (1.581 m)   Wt 183 lb (83 kg)   SpO2 100%   BMI 33.20 kg/m       Objective:   Physical Exam Constitutional:      Appearance: She is well-developed.  Neck:     Thyroid: No thyromegaly.  Cardiovascular:     Rate and Rhythm: Normal rate and regular rhythm.     Heart sounds: Normal heart sounds. No murmur heard.   Pulmonary:     Effort: Pulmonary effort is normal. No respiratory distress.     Breath sounds: Examination of the right-middle field reveals rales. Rales present. No wheezing.  Musculoskeletal:     Cervical back: Neck supple.  Skin:    General: Skin is warm and dry.  Neurological:     Mental Status: She is alert and oriented to person, place, and time.  Psychiatric:        Behavior: Behavior normal.        Thought Content: Thought content normal.        Judgment: Judgment normal.           Assessment & Plan:  HTN-initial blood pressure was elevated.  Follow-up blood pressure manual check was improved few minutes later.  We will continue current medication/doses.  Obtain complete metabolic panel to assess electrolytes and renal function.  Peripheral neuropathy-symptoms are well controlled with evening dose of gabapentin.  She does not take gabapentin during the day as it makes her sleepy.  Hyperlipidemia-tolerating statin.  Obtain follow-up lipid panel.  Tobacco abuse-reinforced importance of quitting completely.  Abnormal lung exam-will obtain chest x-ray for further evaluation.  This visit occurred during the SARS-CoV-2 public health emergency.  Safety protocols were in place, including screening questions prior to the visit, additional usage of staff PPE, and extensive cleaning of exam room while observing appropriate contact time as indicated for disinfecting solutions.

## 2020-04-17 ENCOUNTER — Telehealth: Payer: Self-pay | Admitting: Family

## 2020-04-17 NOTE — Telephone Encounter (Signed)
Opened in error

## 2020-04-22 ENCOUNTER — Telehealth: Payer: Self-pay | Admitting: Family

## 2020-04-22 NOTE — Telephone Encounter (Signed)
Patient  Is calling in regards to imaging results

## 2020-04-22 NOTE — Telephone Encounter (Signed)
Caller: Shebra Call back phone number: [phone]  Patient returning your call for x-ray results.

## 2020-04-22 NOTE — Progress Notes (Signed)
Mailed out to patient 

## 2020-04-22 NOTE — Telephone Encounter (Signed)
Patient advised chest xray was normal with no pneumonia

## 2020-05-03 ENCOUNTER — Telehealth: Payer: Self-pay | Admitting: Family

## 2020-05-03 DIAGNOSIS — Z122 Encounter for screening for malignant neoplasm of respiratory organs: Secondary | ICD-10-CM

## 2020-05-03 NOTE — Telephone Encounter (Signed)
Patient advised of provider's comments and that she will get a call to set up CT

## 2020-05-03 NOTE — Telephone Encounter (Signed)
Please let pt know that I reviewed her record and I would like for her to have a lung cancer screening CT. I have placed order.

## 2020-05-15 ENCOUNTER — Other Ambulatory Visit: Payer: Self-pay

## 2020-05-15 ENCOUNTER — Ambulatory Visit (HOSPITAL_BASED_OUTPATIENT_CLINIC_OR_DEPARTMENT_OTHER)
Admission: RE | Admit: 2020-05-15 | Discharge: 2020-05-15 | Disposition: A | Discharge status: Home / Self Care | Payer: Medicare Other | Source: Ambulatory Visit | Attending: Family | Admitting: Family

## 2020-05-15 DIAGNOSIS — I700 Atherosclerosis of aorta: Secondary | ICD-10-CM | POA: Diagnosis not present

## 2020-05-15 DIAGNOSIS — I2510 Atherosclerotic heart disease of native coronary artery without angina pectoris: Secondary | ICD-10-CM | POA: Diagnosis not present

## 2020-05-15 DIAGNOSIS — J432 Centrilobular emphysema: Secondary | ICD-10-CM | POA: Insufficient documentation

## 2020-05-15 DIAGNOSIS — Z122 Encounter for screening for malignant neoplasm of respiratory organs: Secondary | ICD-10-CM | POA: Diagnosis not present

## 2020-05-15 DIAGNOSIS — F17210 Nicotine dependence, cigarettes, uncomplicated: Secondary | ICD-10-CM | POA: Insufficient documentation

## 2020-05-16 ENCOUNTER — Encounter: Payer: Self-pay | Admitting: Family

## 2020-05-16 NOTE — Progress Notes (Signed)
Mailed out to pt 

## 2020-06-01 ENCOUNTER — Other Ambulatory Visit: Payer: Self-pay | Admitting: Family

## 2020-07-17 ENCOUNTER — Encounter: Payer: Self-pay | Admitting: Family

## 2020-07-17 ENCOUNTER — Ambulatory Visit (INDEPENDENT_AMBULATORY_CARE_PROVIDER_SITE_OTHER): Payer: Medicare Other | Admitting: Family

## 2020-07-17 ENCOUNTER — Other Ambulatory Visit: Payer: Self-pay

## 2020-07-17 VITALS — BP 168/92 | HR 67 | Resp 16 | Ht 62.0 in | Wt 189.0 lb

## 2020-07-17 DIAGNOSIS — Z720 Tobacco use: Secondary | ICD-10-CM

## 2020-07-17 DIAGNOSIS — E785 Hyperlipidemia, unspecified: Secondary | ICD-10-CM | POA: Diagnosis not present

## 2020-07-17 DIAGNOSIS — I10 Essential (primary) hypertension: Secondary | ICD-10-CM | POA: Diagnosis not present

## 2020-07-17 DIAGNOSIS — E1169 Type 2 diabetes mellitus with other specified complication: Secondary | ICD-10-CM

## 2020-07-17 DIAGNOSIS — Z23 Encounter for immunization: Secondary | ICD-10-CM | POA: Diagnosis not present

## 2020-07-17 MED ORDER — LOSARTAN POTASSIUM 100 MG PO TABS: 100.0000 mg | ORAL_TABLET | Freq: Every day | ORAL | 1 refills | Status: DC

## 2020-07-17 NOTE — Progress Notes (Signed)
Subjective:    Patient ID: Janice Meadows, female    DOB: [birth year], 69 y.o.   MRN: [id number]  HPI  Patient is a 69 yr old female who presents today for follow up.  HTN- bp meds include hctz and losartan.  BP Readings from Last 3 Encounters:  07/17/20 (!) 168/92  04/16/20 (!) 148/88  07/25/19 132/80   Hyperlipidemia- maintained on simvastatin.  Lab Results  Component Value Date   CHOL 158 04/16/2020   HDL 66.90 04/16/2020   LDLCALC 81 04/16/2020   TRIG 47.0 04/16/2020   CHOLHDL 2 04/16/2020   Pt completed pfizer vaccine series.   Peripheral neuropathy- on gabapentin 300mg   PO QHS. She states that this remains helpful for her in controlling her symptoms.   Tobacco abuse-  Down to 4 cigarettes a day.    Review of Systems See HPI  Past Medical History:  Diagnosis Date  . High cholesterol   . Hypertension   . Neuropathy      Social History   Socioeconomic History  . Marital status: Single    Spouse name: Not on file  . Number of children: Not on file  . Years of education: Not on file  . Highest education level: Not on file  Occupational History  . Not on file  Tobacco Use  . Smoking status: Current Every Day Smoker    Packs/day: 0.50    Years: 22.00    Pack years: 11.00  . Smokeless tobacco: Never Used  . Tobacco comment: 5 cigarettes daily  Vaping Use  . Vaping Use: Never used  Substance and Sexual Activity  . Alcohol use: Yes    Alcohol/week: 0.0 standard drinks    Comment: occasional  . Drug use: No  . Sexual activity: Never  Other Topics Concern  . Not on file  Social History Narrative   Works in a [employer]   7 children- one deceased shortly after birth   Lives alone but frequently has family visit   Has 7 grandchildren   Enjoys walking, playing grand kids, park   Dog- outdoor dog.     Social Determinants of Health   Financial Resource Strain:   . Difficulty of Paying Living Expenses: Not on file  Food Insecurity:   .  Worried About Charity fundraiser in the Last Year: Not on file  . Ran Out of Food in the Last Year: Not on file  Transportation Needs:   . Lack of Transportation (Medical): Not on file  . Lack of Transportation (Non-Medical): Not on file  Physical Activity:   . Days of Exercise per Week: Not on file  . Minutes of Exercise per Session: Not on file  Stress:   . Feeling of Stress : Not on file  Social Connections:   . Frequency of Communication with Friends and Family: Not on file  . Frequency of Social Gatherings with Friends and Family: Not on file  . Attends Religious Services: Not on file  . Active Member of Clubs or Organizations: Not on file  . Attends Archivist Meetings: Not on file  . Marital Status: Not on file  Intimate Partner Violence:   . Fear of Current or Ex-Partner: Not on file  . Emotionally Abused: Not on file  . Physically Abused: Not on file  . Sexually Abused: Not on file    Past Surgical History:  Procedure Laterality Date  . ABDOMINAL HYSTERECTOMY    . BREAST CYST  EXCISION     bilat  . TOOTH EXTRACTION  04/28/2017    Family History  Problem Relation Age of Onset  . Hypertension Mother   . Hypertension Father   . Cancer Sister 41       lymphoma   . Hypertension Sister   . Kidney disease Daughter   . Stroke Daughter   . Hypertension Sister   . Hypertension Sister   . Hypertension Brother   . Hypertension Brother   . Gout Brother   . Hypertension Brother   . Hypertension Brother   . Colon cancer Neg Hx     No Known Allergies  Current Outpatient Medications on File Prior to Visit  Medication Sig Dispense Refill  . Calcium Carbonate-Vitamin D 600-400 MG-UNIT tablet Take 1 tablet by mouth 2 (two) times daily.    Marland Kitchen gabapentin (NEURONTIN) 300 MG capsule Take 1 capsule (300 mg total) by mouth at bedtime. TAKE 1 CAPSULE(300 MG) BY MOUTH TWICE DAILY 90 capsule 0  . hydrochlorothiazide (HYDRODIURIL) 25 MG tablet TAKE 1 TABLET BY MOUTH EVERY  DAY 90 tablet 1  . loratadine (CLARITIN) 10 MG tablet Take 1 tablet (10 mg total) by mouth daily. 30 tablet 11  . Multiple Vitamin (MULTIVITAMIN) tablet Take 1 tablet by mouth daily. One-a-day womens    . potassium chloride SA (KLOR-CON) 20 MEQ tablet TAKE 1 TABLET(20 MEQ) BY MOUTH DAILY 90 tablet 1  . simvastatin (ZOCOR) 40 MG tablet Take 1 tablet (40 mg total) by mouth every evening. Needs office visit for f/up assessment 90 tablet 1   No current facility-administered medications on file prior to visit.    BP (!) 168/92 (BP Location: Right Arm, Patient Position: Sitting, Cuff Size: Large)   Pulse 67   Resp 16   Ht 5\' 2"  (1.575 m)   Wt 189 lb (85.7 kg)   SpO2 96%   BMI 34.57 kg/m       Objective:   Physical Exam Constitutional:      Appearance: She is well-developed.  Neck:     Thyroid: No thyromegaly.  Cardiovascular:     Rate and Rhythm: Normal rate and regular rhythm.     Heart sounds: Normal heart sounds. No murmur heard.   Pulmonary:     Effort: Pulmonary effort is normal. No respiratory distress.     Breath sounds: Normal breath sounds. No wheezing.  Musculoskeletal:     Cervical back: Neck supple.  Skin:    General: Skin is warm and dry.  Neurological:     Mental Status: She is alert and oriented to person, place, and time.  Psychiatric:        Behavior: Behavior normal.        Thought Content: Thought content normal.        Judgment: Judgment normal.           Assessment & Plan:  HTN- reports bp's are running high at home as well and reports good med compliance. Will increase losartan to 100mg  once daily. Plan to bring her back in 1 weeks for BP  Recheck and repeat bmet.  Hyperlipidemia- lipids at goal. Continue simvastatin.  Peripheral neuropathy- symptoms stable with HS gabapentin.  Tobacco abuse- she has cut down.  I encouraged her to quit completely.  High dose flu shot today.  This visit occurred during the SARS-CoV-2 public health emergency.   Safety protocols were in place, including screening questions prior to the visit, additional usage of staff PPE, and extensive cleaning  of exam room while observing appropriate contact time as indicated for disinfecting solutions.

## 2020-07-17 NOTE — Patient Instructions (Signed)
Please increase your losartan to 100mg  once daily.  Keep working on cutting back your cigarettes.

## 2020-07-24 ENCOUNTER — Ambulatory Visit (INDEPENDENT_AMBULATORY_CARE_PROVIDER_SITE_OTHER): Payer: Medicare Other | Admitting: Family

## 2020-07-24 ENCOUNTER — Encounter: Payer: Self-pay | Admitting: Family

## 2020-07-24 ENCOUNTER — Other Ambulatory Visit: Payer: Self-pay

## 2020-07-24 VITALS — BP 140/80 | HR 69 | Temp 97.4°F | Resp 16 | Ht 62.0 in | Wt 187.0 lb

## 2020-07-24 DIAGNOSIS — I10 Essential (primary) hypertension: Secondary | ICD-10-CM

## 2020-07-24 MED ORDER — LORATADINE 10 MG PO TABS: 10.0000 mg | ORAL_TABLET | Freq: Every day | ORAL | 4 refills | Status: AC

## 2020-07-24 MED ORDER — HYDROCHLOROTHIAZIDE 25 MG PO TABS: ORAL_TABLET | ORAL | 1 refills | Status: DC

## 2020-07-24 NOTE — Progress Notes (Signed)
Subjective:    Patient ID: Janice Meadows, female    DOB: 23-Feb-1951, 69 y.o.   MRN: [id number]  HPI   Patient is a 69 yr old female who presents today for follow up of her blood pressure.   Last visit we increased her losartan to 100mg . She was continued on hctz 25mg  once daily. She reports tolerating new dose of losartan. Has also noted improved bp readings at home when she checks.    BP Readings from Last 3 Encounters:  07/24/20 140/80  07/17/20 (!) 168/92  04/16/20 (!) 148/88      Review of Systems See HPI  Past Medical History:  Diagnosis Date  . High cholesterol   . Hypertension   . Neuropathy      Social History   Socioeconomic History  . Marital status: Single    Spouse name: Not on file  . Number of children: Not on file  . Years of education: Not on file  . Highest education level: Not on file  Occupational History  . Not on file  Tobacco Use  . Smoking status: Current Every Day Smoker    Packs/day: 0.50    Years: 22.00    Pack years: 11.00  . Smokeless tobacco: Never Used  . Tobacco comment: 5 cigarettes daily  Vaping Use  . Vaping Use: Never used  Substance and Sexual Activity  . Alcohol use: Yes    Alcohol/week: 0.0 standard drinks    Comment: occasional  . Drug use: No  . Sexual activity: Never  Other Topics Concern  . Not on file  Social History Narrative   Works in a [place]   7 children- one deceased shortly after birth   Lives alone but frequently has family visit   Has 7 grandchildren   Enjoys walking, playing grand kids, park   Dog- outdoor dog.     Social Determinants of Health   Financial Resource Strain:   . Difficulty of Paying Living Expenses: Not on file  Food Insecurity:   . Worried About Charity fundraiser in the Last Year: Not on file  . Ran Out of Food in the Last Year: Not on file  Transportation Needs:   . Lack of Transportation (Medical): Not on file  . Lack of Transportation (Non-Medical): Not  on file  Physical Activity:   . Days of Exercise per Week: Not on file  . Minutes of Exercise per Session: Not on file  Stress:   . Feeling of Stress : Not on file  Social Connections:   . Frequency of Communication with Friends and Family: Not on file  . Frequency of Social Gatherings with Friends and Family: Not on file  . Attends Religious Services: Not on file  . Active Member of Clubs or Organizations: Not on file  . Attends Archivist Meetings: Not on file  . Marital Status: Not on file  Intimate Partner Violence:   . Fear of Current or Ex-Partner: Not on file  . Emotionally Abused: Not on file  . Physically Abused: Not on file  . Sexually Abused: Not on file    Past Surgical History:  Procedure Laterality Date  . ABDOMINAL HYSTERECTOMY    . BREAST CYST EXCISION     bilat  . TOOTH EXTRACTION  04/28/2017    Family History  Problem Relation Age of Onset  . Hypertension Mother   . Hypertension Father   . Cancer Sister 69  lymphoma   . Hypertension Sister   . Kidney disease Daughter   . Stroke Daughter   . Hypertension Sister   . Hypertension Sister   . Hypertension Brother   . Hypertension Brother   . Gout Brother   . Hypertension Brother   . Hypertension Brother   . Colon cancer Neg Hx     No Known Allergies  Current Outpatient Medications on File Prior to Visit  Medication Sig Dispense Refill  . Calcium Carbonate-Vitamin D 600-400 MG-UNIT tablet Take 1 tablet by mouth 2 (two) times daily.    Marland Kitchen gabapentin (NEURONTIN) 300 MG capsule Take 1 capsule (300 mg total) by mouth at bedtime. TAKE 1 CAPSULE(300 MG) BY MOUTH TWICE DAILY 90 capsule 0  . losartan (COZAAR) 100 MG tablet Take 1 tablet (100 mg total) by mouth daily. 90 tablet 1  . Multiple Vitamin (MULTIVITAMIN) tablet Take 1 tablet by mouth daily. One-a-day womens    . potassium chloride SA (KLOR-CON) 20 MEQ tablet TAKE 1 TABLET(20 MEQ) BY MOUTH DAILY 90 tablet 1  . simvastatin (ZOCOR) 40 MG  tablet Take 1 tablet (40 mg total) by mouth every evening. Needs office visit for f/up assessment 90 tablet 1   No current facility-administered medications on file prior to visit.    BP 140/80 (BP Location: Right Arm, Patient Position: Sitting, Cuff Size: Normal)   Pulse 69   Temp (!) 97.4 F (36.3 C) (Temporal)   Resp 16   Ht 5\' 2"  (1.575 m)   Wt 187 lb (84.8 kg)   SpO2 96%   BMI 34.20 kg/m       Objective:   Physical Exam Constitutional:      Appearance: She is well-developed.  Cardiovascular:     Rate and Rhythm: Normal rate and regular rhythm.     Heart sounds: Normal heart sounds. No murmur heard.   Pulmonary:     Effort: Pulmonary effort is normal. No respiratory distress.     Breath sounds: Normal breath sounds. No wheezing.  Psychiatric:        Behavior: Behavior normal.        Thought Content: Thought content normal.        Judgment: Judgment normal.           Assessment & Plan:  HTN- bp stable/improved on losartan 100mg  once daily. Continue current dosing. Obtain follow up BMET to assess K+ and Cr.   This visit occurred during the SARS-CoV-2 public health emergency.  Safety protocols were in place, including screening questions prior to the visit, additional usage of staff PPE, and extensive cleaning of exam room while observing appropriate contact time as indicated for disinfecting solutions.

## 2020-07-25 ENCOUNTER — Encounter: Payer: Self-pay | Admitting: Family

## 2020-07-25 LAB — BASIC METABOLIC PANEL
BUN: 20 mg/dL (ref 7–25)
CO2: 28 mmol/L (ref 20–32)
Calcium: 9.6 mg/dL (ref 8.6–10.4)
Chloride: 105 mmol/L (ref 98–110)
Creat: 0.99 mg/dL (ref 0.50–0.99)
Glucose, Bld: 90 mg/dL (ref 65–99)
Potassium: 4.2 mmol/L (ref 3.5–5.3)
Sodium: 142 mmol/L (ref 135–146)

## 2020-07-25 NOTE — Progress Notes (Signed)
Mailed ou to patient 

## 2020-09-23 DIAGNOSIS — M1711 Unilateral primary osteoarthritis, right knee: Secondary | ICD-10-CM | POA: Diagnosis not present

## 2020-10-13 ENCOUNTER — Other Ambulatory Visit: Payer: Self-pay | Admitting: Family

## 2020-10-25 IMAGING — MG MM DIGITAL SCREENING BILAT W/ TOMO W/ CAD
8 series · 8 of 24 positions shown · non-contrast
Comparison: Previous exam(s).

CLINICAL DATA: Screening.

EXAM:
DIGITAL SCREENING BILATERAL MAMMOGRAM WITH TOMO AND CAD

[L CC synth-2D]
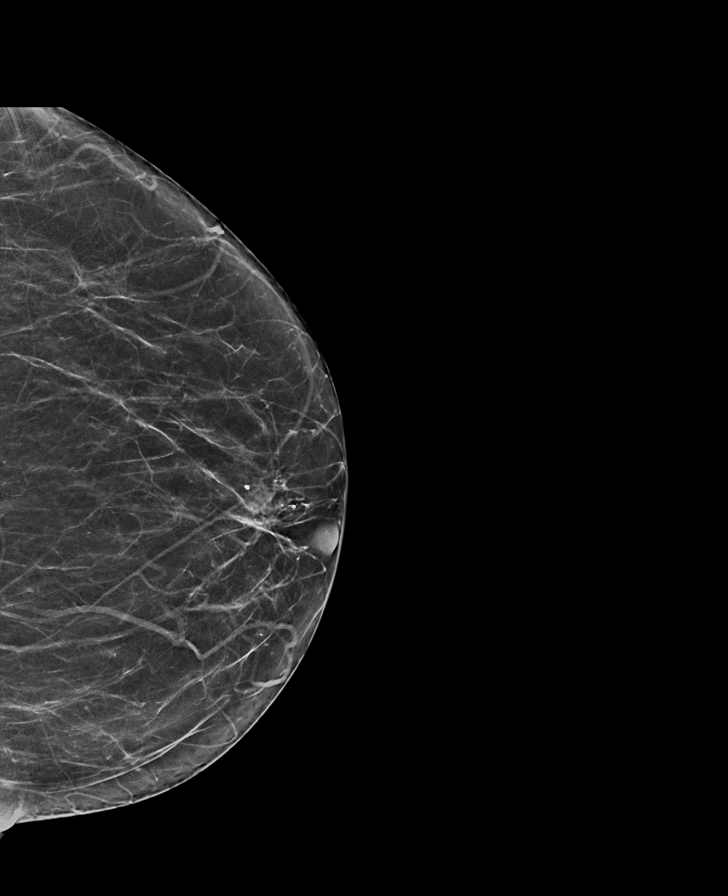

[R MLO synth-2D]
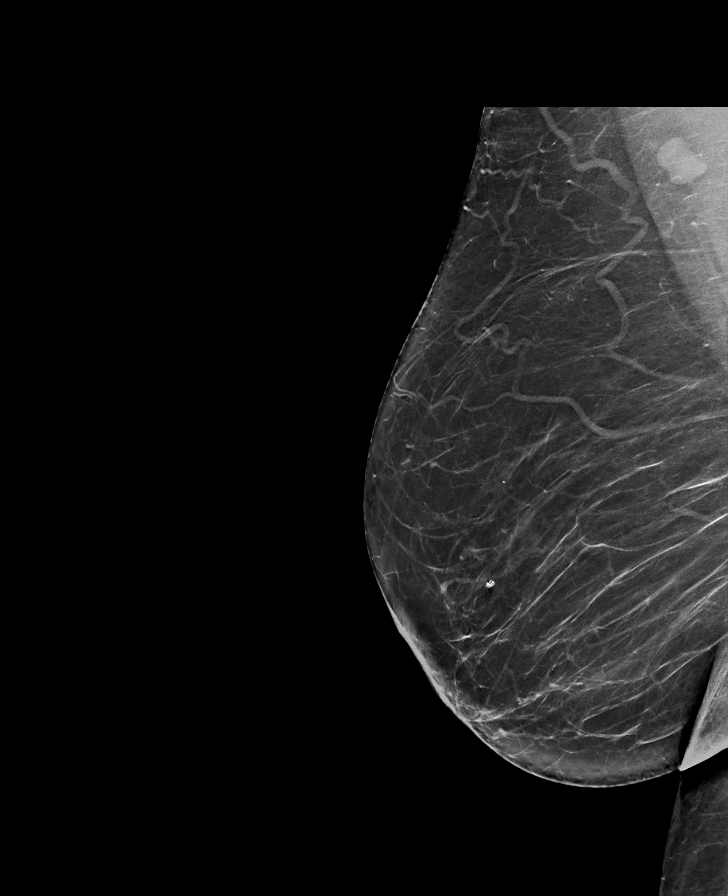

[L MLO synth-2D]
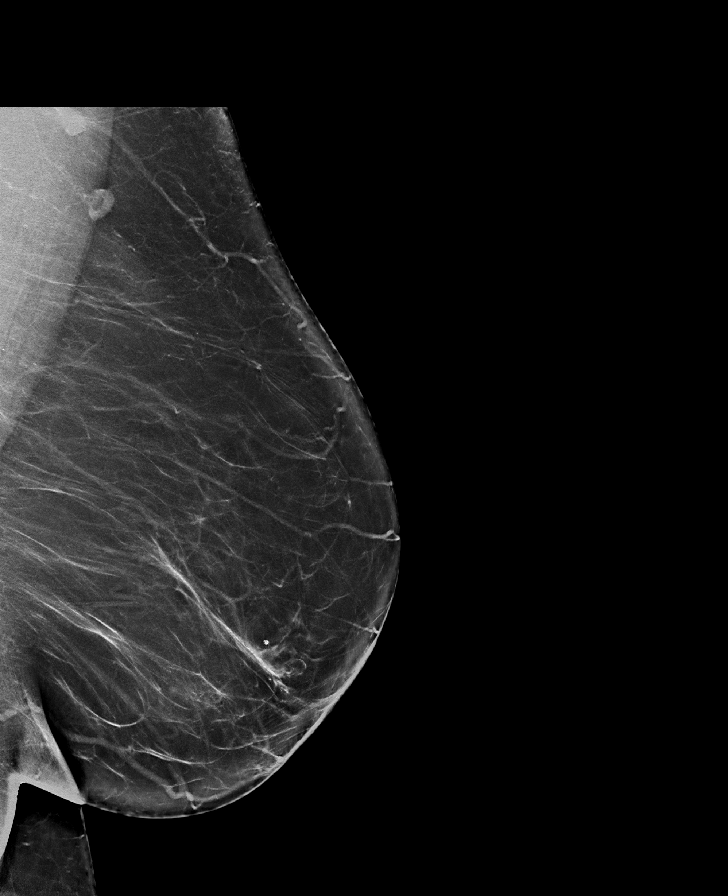

[R CC synth-2D]
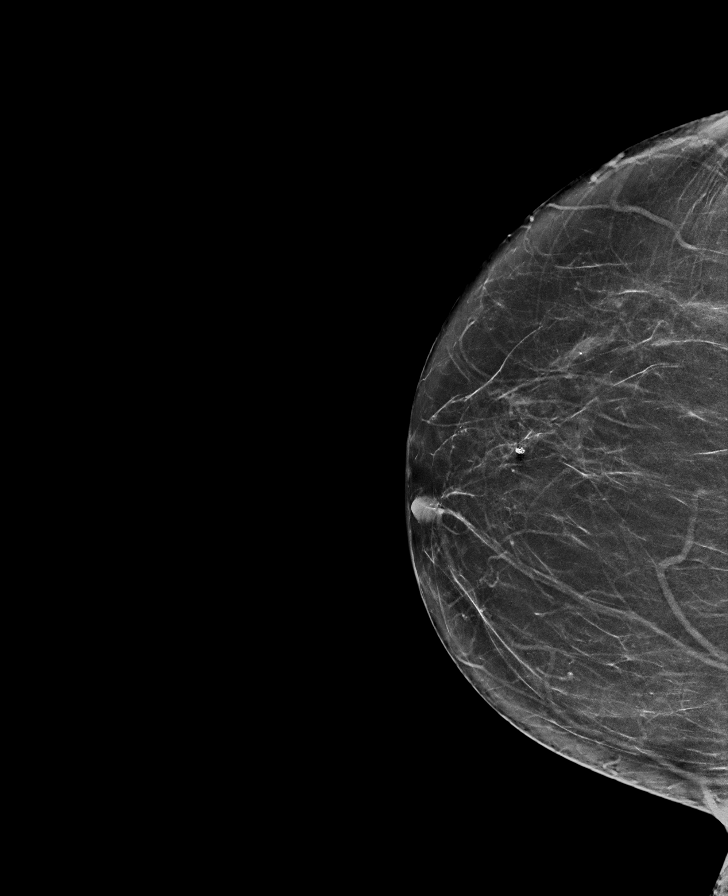

[L CC tomo · tomo slice 31/60.0]
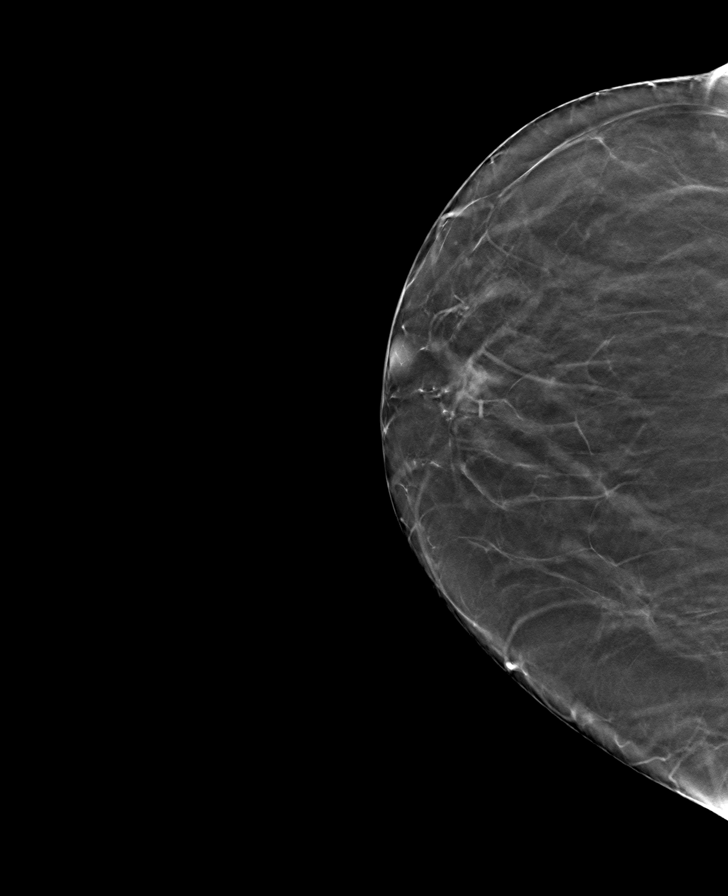

[L MLO tomo · tomo slice 43/85.0]
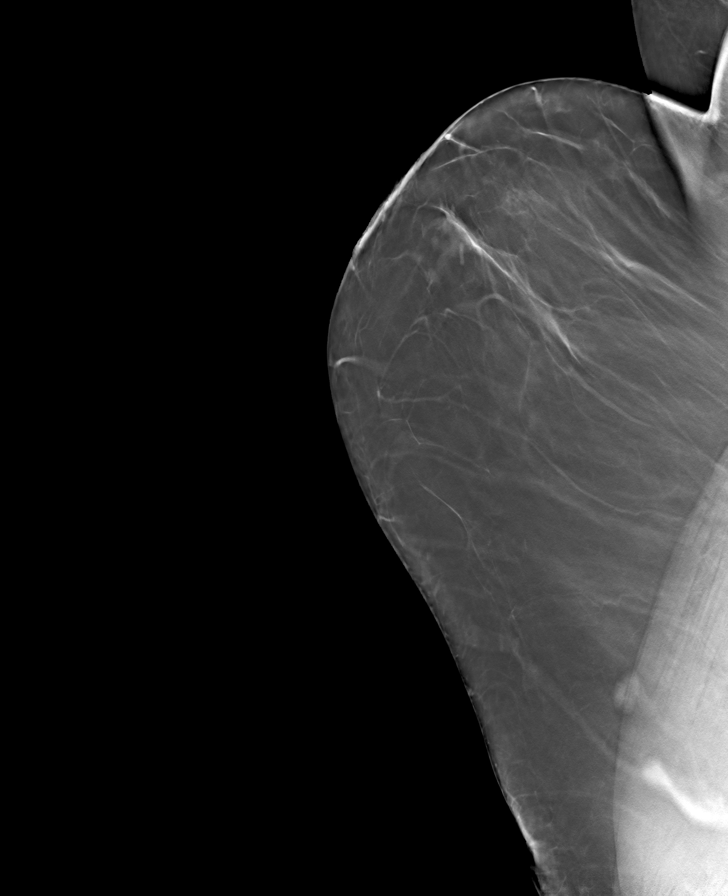

[R MLO tomo · tomo slice 39/78.0]
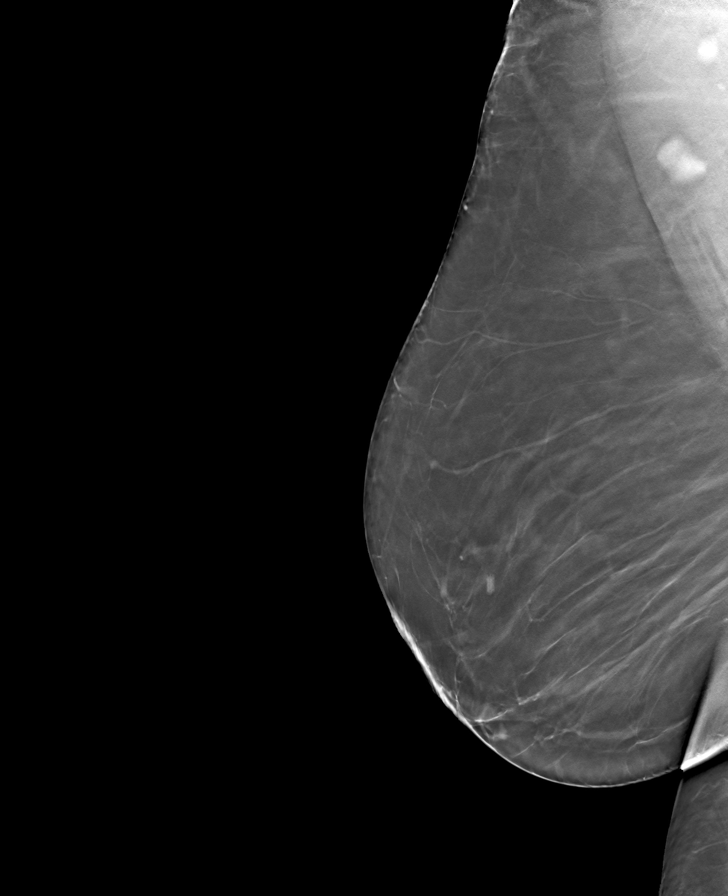

[R CC tomo · tomo slice 32/63.0]
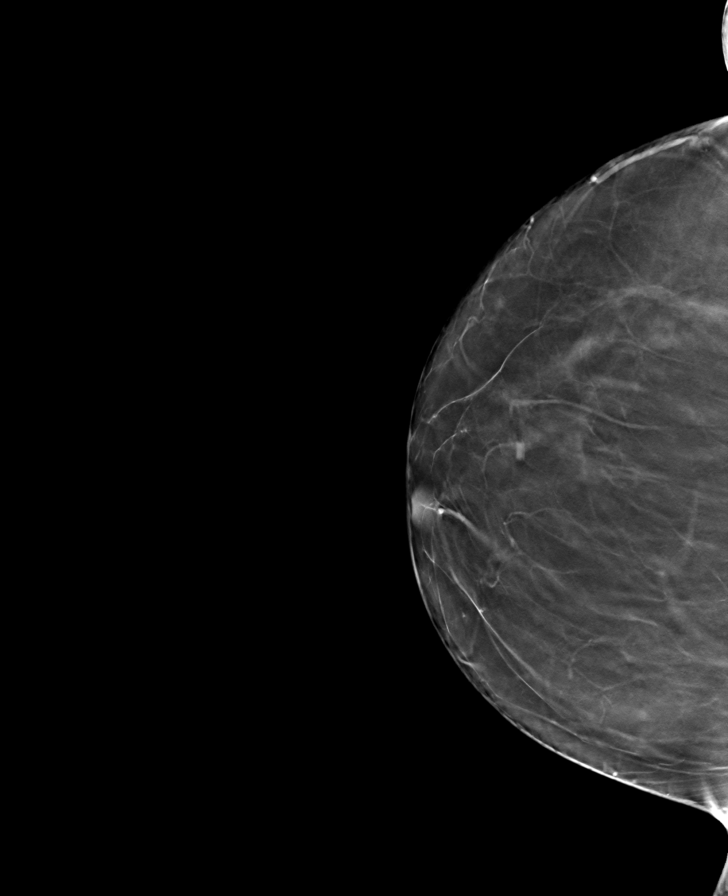

[8 of 24 positions shown; findings below may reference images not displayed]

ACR Breast Density Category b: There are scattered areas of
fibroglandular density.
FINDINGS: There are no findings suspicious for malignancy. Images were
processed with CAD.
IMPRESSION: No mammographic evidence of malignancy. A result letter of this
screening mammogram will be mailed directly to the patient.

RECOMMENDATION:
Screening mammogram in one year. (Code:[id number]-775)

BI-RADS CATEGORY  1: Negative.

## 2020-11-21 ENCOUNTER — Telehealth: Payer: Self-pay

## 2020-11-21 DIAGNOSIS — Z20822 Contact with and (suspected) exposure to covid-19: Secondary | ICD-10-CM | POA: Diagnosis not present

## 2020-11-21 DIAGNOSIS — Z03818 Encounter for observation for suspected exposure to other biological agents ruled out: Secondary | ICD-10-CM | POA: Diagnosis not present

## 2020-11-21 NOTE — Telephone Encounter (Signed)
Caller states she needs to be tested for covid. She has been exposed.  Telephone: [phone]

## 2020-11-21 NOTE — Telephone Encounter (Signed)
Patient reports she is not having any symptoms and that she was given the number to schedule testing. Patient advised if she starts having symptoms or test positive she can call us back for a virtual visit. She agrees.

## 2020-11-28 ENCOUNTER — Other Ambulatory Visit: Payer: Self-pay | Admitting: Family

## 2020-12-09 ENCOUNTER — Encounter: Payer: Self-pay | Admitting: Gastroenterology

## 2021-01-21 ENCOUNTER — Encounter: Payer: Self-pay | Admitting: Family

## 2021-01-21 ENCOUNTER — Other Ambulatory Visit: Payer: Self-pay

## 2021-01-21 ENCOUNTER — Ambulatory Visit (INDEPENDENT_AMBULATORY_CARE_PROVIDER_SITE_OTHER): Payer: Medicare Other | Admitting: Family

## 2021-01-21 VITALS — BP 157/92 | HR 70 | Temp 98.1°F | Resp 16 | Ht 62.0 in | Wt 179.0 lb

## 2021-01-21 DIAGNOSIS — Z720 Tobacco use: Secondary | ICD-10-CM | POA: Diagnosis not present

## 2021-01-21 DIAGNOSIS — L659 Nonscarring hair loss, unspecified: Secondary | ICD-10-CM | POA: Diagnosis not present

## 2021-01-21 DIAGNOSIS — G629 Polyneuropathy, unspecified: Secondary | ICD-10-CM

## 2021-01-21 DIAGNOSIS — I10 Essential (primary) hypertension: Secondary | ICD-10-CM

## 2021-01-21 DIAGNOSIS — E785 Hyperlipidemia, unspecified: Secondary | ICD-10-CM | POA: Diagnosis not present

## 2021-01-21 LAB — BASIC METABOLIC PANEL
BUN: 11 mg/dL (ref 6–23)
CO2: 29 mEq/L (ref 19–32)
Calcium: 8.8 mg/dL (ref 8.4–10.5)
Chloride: 105 mEq/L (ref 96–112)
Creatinine, Ser: 0.88 mg/dL (ref 0.40–1.20)
GFR: 67.14 mL/min (ref >60.00)
Glucose, Bld: 92 mg/dL (ref 70–99)
Potassium: 3.8 mEq/L (ref 3.5–5.1)
Sodium: 142 mEq/L (ref 135–145)

## 2021-01-21 MED ORDER — AMLODIPINE BESYLATE 5 MG PO TABS: 5.0000 mg | ORAL_TABLET | Freq: Every day | ORAL | 3 refills | Status: DC

## 2021-01-21 NOTE — Progress Notes (Signed)
Subjective:    Patient ID: Janice Meadows, female    DOB: 20-Apr-1951, 70 y.o.   MRN: [id number]  HPI  Patient is a 70 yr old female who presents today for follow up.  HTN- maintained on hctz 25mg  and losartan 100mg . She reports good compliance with medication.   BP Readings from Last 3 Encounters:  01/21/21 (!) 157/92  07/24/20 140/80  07/17/20 (!) 168/92   Hyperlipidemia- maintained on simvastatin 40mg . Reports no myalgia.   Lab Results  Component Value Date   CHOL 158 04/16/2020   HDL 66.90 04/16/2020   LDLCALC 81 04/16/2020   TRIG 47.0 04/16/2020   CHOLHDL 2 04/16/2020   Peripheral neuropathy- maintained on gabapentin 300mg  HS. Reports that this helps.   Tobacco abuse- reports smoking more lately.  Notes a lot of stress lately.Father has dementia, sister on HD and also suffering from CHF.  She c/o scalp itching and hair loss.  Review of Systems See HPI  Past Medical History:  Diagnosis Date  . High cholesterol   . Hypertension   . Neuropathy      Social History   Socioeconomic History  . Marital status: Single    Spouse name: Not on file  . Number of children: Not on file  . Years of education: Not on file  . Highest education level: Not on file  Occupational History  . Not on file  Tobacco Use  . Smoking status: Current Every Day Smoker    Packs/day: 0.50    Years: 22.00    Pack years: 11.00  . Smokeless tobacco: Never Used  . Tobacco comment: 5 cigarettes daily  Vaping Use  . Vaping Use: Never used  Substance and Sexual Activity  . Alcohol use: Yes    Alcohol/week: 0.0 standard drinks    Comment: occasional  . Drug use: No  . Sexual activity: Never  Other Topics Concern  . Not on file  Social History Narrative   Works in a [place]   7 children- one deceased shortly after birth   Lives alone but frequently has family visit   Has 7 grandchildren   Enjoys walking, playing grand kids, park   Dog- outdoor dog.     Social  Determinants of Health   Financial Resource Strain: Not on file  Food Insecurity: Not on file  Transportation Needs: Not on file  Physical Activity: Not on file  Stress: Not on file  Social Connections: Not on file  Intimate Partner Violence: Not on file    Past Surgical History:  Procedure Laterality Date  . ABDOMINAL HYSTERECTOMY    . BREAST CYST EXCISION     bilat  . TOOTH EXTRACTION  04/28/2017    Family History  Problem Relation Age of Onset  . Hypertension Mother   . Hypertension Father   . Cancer Sister 37       lymphoma   . Hypertension Sister   . Kidney disease Daughter   . Stroke Daughter   . Hypertension Sister   . Hypertension Sister   . Hypertension Brother   . Hypertension Brother   . Gout Brother   . Hypertension Brother   . Hypertension Brother   . Colon cancer Neg Hx     No Known Allergies  Current Outpatient Medications on File Prior to Visit  Medication Sig Dispense Refill  . Calcium Carbonate-Vitamin D 600-400 MG-UNIT tablet Take 1 tablet by mouth 2 (two) times daily.    Marland Kitchen gabapentin (  NEURONTIN) 300 MG capsule Take 1 capsule (300 mg total) by mouth at bedtime. 90 capsule 1  . hydrochlorothiazide (HYDRODIURIL) 25 MG tablet TAKE 1 TABLET BY MOUTH EVERY DAY 90 tablet 1  . loratadine (CLARITIN) 10 MG tablet Take 1 tablet (10 mg total) by mouth daily. 90 tablet 4  . losartan (COZAAR) 100 MG tablet Take 1 tablet (100 mg total) by mouth daily. 90 tablet 1  . Multiple Vitamin (MULTIVITAMIN) tablet Take 1 tablet by mouth daily. One-a-day womens    . potassium chloride SA (KLOR-CON) 20 MEQ tablet TAKE 1 TABLET(20 MEQ) BY MOUTH DAILY 90 tablet 1  . simvastatin (ZOCOR) 40 MG tablet TAKE 1 TABLET(40 MG) BY MOUTH EVERY EVENING 90 tablet 1   No current facility-administered medications on file prior to visit.    BP (!) 157/92 (BP Location: Right Arm, Patient Position: Sitting, Cuff Size: Normal)   Pulse 70   Temp 98.1 F (36.7 C)   Resp 16   Ht 5\' 2"   (1.575 m)   Wt 179 lb (81.2 kg)   SpO2 96%   BMI 32.74 kg/m       Objective:   Physical Exam Constitutional:      Appearance: She is well-developed.  Cardiovascular:     Rate and Rhythm: Normal rate and regular rhythm.     Heart sounds: Normal heart sounds. No murmur heard.   Pulmonary:     Effort: Pulmonary effort is normal. No respiratory distress.     Breath sounds: Normal breath sounds. No wheezing.  Skin:    General: Skin is warm and dry.     Comments: Balding noted on top of head Scalp skin appears healthy without rash, dryness noted  Psychiatric:        Behavior: Behavior normal.        Thought Content: Thought content normal.        Judgment: Judgment normal.           Assessment & Plan:  HTN- uncontrolled. Add amlodipine 5mg  once daily. Check follow up bmet.  Alopecia- refer to dermatology. Recommended neutrogena T-gel shampoo.  Tobacco abuse- discussed importance of smoking cessation.  Neuropathy- stable with HS gabapentin 300 mg once daily. Continue same.  Hyperlipidemia- lipids at goal. Continue simvastatin 40mg  once daily.   This visit occurred during the SARS-CoV-2 public health emergency.  Safety protocols were in place, including screening questions prior to the visit, additional usage of staff PPE, and extensive cleaning of exam room while observing appropriate contact time as indicated for disinfecting solutions.

## 2021-01-21 NOTE — Patient Instructions (Addendum)
Please call GI to schedule your colonoscopy. Please add amlodipine 5mg  once daily for blood pressure. Please get your Covid Booster shot.  Try using neutrogena T gel shampoo to help with scalp itching. You should be contacted about scheduling your appointment with dermatology for the hair loss.

## 2021-02-06 ENCOUNTER — Other Ambulatory Visit: Payer: Self-pay | Admitting: Family

## 2021-02-11 ENCOUNTER — Other Ambulatory Visit: Payer: Self-pay

## 2021-02-11 ENCOUNTER — Ambulatory Visit (INDEPENDENT_AMBULATORY_CARE_PROVIDER_SITE_OTHER): Payer: Medicare Other | Admitting: Family

## 2021-02-11 VITALS — BP 129/76 | HR 78 | Temp 98.4°F | Resp 16 | Ht 62.0 in | Wt 180.0 lb

## 2021-02-11 DIAGNOSIS — I10 Essential (primary) hypertension: Secondary | ICD-10-CM

## 2021-02-11 NOTE — Progress Notes (Signed)
Subjective:    Patient ID: Janice Meadows, female    DOB: September 30, 1951, 70 y.o.   MRN: [id number]  HPI  Patient is a 70 yr old female who presents today for follow up.  HTN- last visit we added amlodipine 5mg  to her regimen.  BP Readings from Last 3 Encounters:  02/11/21 129/76  01/21/21 (!) 157/92  07/24/20 140/80     Review of Systems   See HPI  Past Medical History:  Diagnosis Date  . High cholesterol   . Hypertension   . Neuropathy      Social History   Socioeconomic History  . Marital status: Single    Spouse name: Not on file  . Number of children: Not on file  . Years of education: Not on file  . Highest education level: Not on file  Occupational History  . Not on file  Tobacco Use  . Smoking status: Current Every Day Smoker    Packs/day: 0.50    Years: 22.00    Pack years: 11.00  . Smokeless tobacco: Never Used  . Tobacco comment: 5 cigarettes daily  Vaping Use  . Vaping Use: Never used  Substance and Sexual Activity  . Alcohol use: Yes    Alcohol/week: 0.0 standard drinks    Comment: occasional  . Drug use: No  . Sexual activity: Never  Other Topics Concern  . Not on file  Social History Narrative   Works in a [place]   7 children- one deceased shortly after birth   Lives alone but frequently has family visit   Has 7 grandchildren   Enjoys walking, playing grand kids, park   Dog- outdoor dog.     Social Determinants of Health   Financial Resource Strain: Not on file  Food Insecurity: Not on file  Transportation Needs: Not on file  Physical Activity: Not on file  Stress: Not on file  Social Connections: Not on file  Intimate Partner Violence: Not on file    Past Surgical History:  Procedure Laterality Date  . ABDOMINAL HYSTERECTOMY    . BREAST CYST EXCISION     bilat  . TOOTH EXTRACTION  04/28/2017    Family History  Problem Relation Age of Onset  . Hypertension Mother   . Hypertension Father   . Cancer Sister  74       lymphoma   . Hypertension Sister   . Kidney disease Daughter   . Stroke Daughter   . Hypertension Sister   . Hypertension Sister   . Hypertension Brother   . Hypertension Brother   . Gout Brother   . Hypertension Brother   . Hypertension Brother   . Colon cancer Neg Hx     No Known Allergies  Current Outpatient Medications on File Prior to Visit  Medication Sig Dispense Refill  . amLODipine (NORVASC) 5 MG tablet Take 1 tablet (5 mg total) by mouth daily. 30 tablet 3  . Calcium Carbonate-Vitamin D 600-400 MG-UNIT tablet Take 1 tablet by mouth 2 (two) times daily.    Marland Kitchen gabapentin (NEURONTIN) 300 MG capsule Take 1 capsule (300 mg total) by mouth at bedtime. 90 capsule 1  . hydrochlorothiazide (HYDRODIURIL) 25 MG tablet TAKE 1 TABLET BY MOUTH EVERY DAY 90 tablet 1  . loratadine (CLARITIN) 10 MG tablet Take 1 tablet (10 mg total) by mouth daily. 90 tablet 4  . losartan (COZAAR) 100 MG tablet TAKE 1 TABLET(100 MG) BY MOUTH DAILY 90 tablet 1  .  Multiple Vitamin (MULTIVITAMIN) tablet Take 1 tablet by mouth daily. One-a-day womens    . potassium chloride SA (KLOR-CON) 20 MEQ tablet TAKE 1 TABLET(20 MEQ) BY MOUTH DAILY 90 tablet 1  . simvastatin (ZOCOR) 40 MG tablet TAKE 1 TABLET(40 MG) BY MOUTH EVERY EVENING 90 tablet 1   No current facility-administered medications on file prior to visit.    BP 129/76 (BP Location: Right Arm, Patient Position: Sitting, Cuff Size: Small)   Pulse 78   Temp 98.4 F (36.9 C) (Oral)   Resp 16   Ht 5\' 2"  (1.575 m)   Wt 180 lb (81.6 kg)   SpO2 100%   BMI 32.92 kg/m        Objective:   Physical Exam Constitutional:      Appearance: She is well-developed.  Neck:     Thyroid: No thyromegaly.  Cardiovascular:     Rate and Rhythm: Normal rate and regular rhythm.     Heart sounds: Normal heart sounds. No murmur heard.   Pulmonary:     Effort: Pulmonary effort is normal. No respiratory distress.     Breath sounds: Normal breath sounds.  No wheezing.  Musculoskeletal:     Cervical back: Neck supple.  Skin:    General: Skin is warm and dry.  Neurological:     Mental Status: She is alert and oriented to person, place, and time.  Psychiatric:        Behavior: Behavior normal.        Thought Content: Thought content normal.        Judgment: Judgment normal.           Assessment & Plan:  HTN- bp improved. Continue amlodipine 5mg  once daily.  This visit occurred during the SARS-CoV-2 public health emergency.  Safety protocols were in place, including screening questions prior to the visit, additional usage of staff PPE, and extensive cleaning of exam room while observing appropriate contact time as indicated for disinfecting solutions.

## 2021-02-13 ENCOUNTER — Ambulatory Visit: Payer: Medicare Other | Attending: Internal Medicine

## 2021-02-13 DIAGNOSIS — Z23 Encounter for immunization: Secondary | ICD-10-CM

## 2021-02-13 NOTE — Progress Notes (Signed)
   Covid-[facility] Vaccination [facility]  Name:  Nikita Surman    MRN: [id number] DOB: [birth year]  02/13/2021  Ms. Plaisted was observed post Covid-19 immunization for 15 minutes without incident. She was provided with Vaccine Information Sheet and instruction to access the V-Safe system.   Ms. Kutzer was instructed to call 911 with any severe reactions post vaccine: Marland Kitchen Difficulty breathing  . Swelling of face and throat  . A fast heartbeat  . A bad rash all over body  . Dizziness and weakness   Immunizations Administered    Name Date Dose VIS Date Route   PFIZER Comrnaty(Gray TOP) Covid-19 Vaccine 02/13/2021 11:52 AM 0.3 mL 10/03/2020 Intramuscular   Manufacturer: [employer], [employer]   Lot: [id number]   NDC: [phone]

## 2021-02-17 ENCOUNTER — Other Ambulatory Visit (HOSPITAL_BASED_OUTPATIENT_CLINIC_OR_DEPARTMENT_OTHER): Payer: Self-pay

## 2021-02-17 MED ORDER — PFIZER-BIONT COVID-19 VAC-TRIS 30 MCG/0.3ML IM SUSP
INTRAMUSCULAR | 0 refills | Status: DC
  Filled 2021-02-17: qty 0.3, 1d supply, fill #0

## 2021-03-12 ENCOUNTER — Encounter: Payer: Self-pay | Admitting: Gastroenterology

## 2021-03-25 ENCOUNTER — Other Ambulatory Visit: Payer: Self-pay

## 2021-03-25 ENCOUNTER — Ambulatory Visit (AMBULATORY_SURGERY_CENTER): Payer: Self-pay | Admitting: *Deleted

## 2021-03-25 VITALS — Ht 62.0 in | Wt 182.0 lb

## 2021-03-25 DIAGNOSIS — Z86010 Personal history of colonic polyps: Secondary | ICD-10-CM

## 2021-03-25 MED ORDER — NA SULFATE-K SULFATE-MG SULF 17.5-3.13-1.6 GM/177ML PO SOLN: 1.0000 | Freq: Once | ORAL | 0 refills | Status: AC

## 2021-03-25 NOTE — Progress Notes (Signed)
No egg or soy allergy known to patient  No issues with past sedation with any surgeries or procedures Patient denies ever being told they had issues or difficulty with intubation  No FH of Malignant Hyperthermia No diet pills per patient No home 02 use per patient  No blood thinners per patient  Pt denies issues with constipation  No A fib or A flutter  EMMI video to pt or via [place] 19 guidelines implemented in PV today with Pt and RN  Pt is fully vaccinated  for Covid   Coupon given to pt in PV today , Code to Pharmacy and  NO PA's for preps discussed with pt In PV today  Discussed with pt there will be an out-of-pocket cost for prep and that varies from $0 to 70 dollars   Due to the COVID-19 pandemic we are asking patients to follow certain guidelines.  Pt aware of COVID protocols and [facility] guidelines

## 2021-03-27 DIAGNOSIS — M1712 Unilateral primary osteoarthritis, left knee: Secondary | ICD-10-CM | POA: Diagnosis not present

## 2021-04-07 ENCOUNTER — Other Ambulatory Visit: Payer: Self-pay

## 2021-04-07 ENCOUNTER — Encounter: Payer: Self-pay | Admitting: Gastroenterology

## 2021-04-07 ENCOUNTER — Ambulatory Visit (AMBULATORY_SURGERY_CENTER): Payer: Medicare Other | Admitting: Gastroenterology

## 2021-04-07 VITALS — BP 110/70 | HR 58 | Temp 97.1°F | Resp 21 | Ht 62.0 in | Wt 182.0 lb

## 2021-04-07 DIAGNOSIS — Z1211 Encounter for screening for malignant neoplasm of colon: Secondary | ICD-10-CM | POA: Diagnosis not present

## 2021-04-07 DIAGNOSIS — Z86010 Personal history of colonic polyps: Secondary | ICD-10-CM

## 2021-04-07 MED ORDER — SODIUM CHLORIDE 0.9 % IV SOLN: 500.0000 mL | Freq: Once | INTRAVENOUS | Status: DC

## 2021-04-07 NOTE — Op Note (Signed)
[place] Patient Name: Daionna Crossland Procedure Date: 04/07/2021 8:53 AM MRN: [id number] Endoscopist: [place]. Loletha Carrow , MD Age: 70 Referring MD:  Date of Birth: 1951/01/25 Gender: Female Account #: [id number] Procedure:                Colonoscopy Indications:              Surveillance: Personal history of adenomatous                            polyps on last colonoscopy > 3 years ago                           TA < 21mm x 3 in 07/2017 (patient's first                            colonoscopy) Medicines:                Monitored Anesthesia Care Procedure:                Pre-Anesthesia Assessment:                           - Prior to the procedure, a History and Physical                            was performed, and patient medications and                            allergies were reviewed. The patient's tolerance of                            previous anesthesia was also reviewed. The risks                            and benefits of the procedure and the sedation                            options and risks were discussed with the patient.                            All questions were answered, and informed consent                            was obtained. Prior Anticoagulants: The patient has                            taken no previous anticoagulant or antiplatelet                            agents. ASA Grade Assessment: III - A patient with                            severe systemic disease. After reviewing the risks  and benefits, the patient was deemed in                            satisfactory condition to undergo the procedure.                           After obtaining informed consent, the colonoscope                            was passed under direct vision. Throughout the                            procedure, the patient's blood pressure, pulse, and                            oxygen saturations were monitored continuously. The                             Olympus CF-HQ190L [id number]) Colonoscope was                            introduced through the anus and advanced to the the                            cecum, identified by appendiceal orifice and                            ileocecal valve. The colonoscopy was performed                            without difficulty. The patient tolerated the                            procedure well. The quality of the bowel                            preparation was good. The ileocecal valve,                            appendiceal orifice, and rectum were photographed.                            The bowel preparation used was SUPREP. Scope In: 9:00:25 AM Scope Out: 9:12:18 AM Scope Withdrawal Time: 0 hours 9 minutes 29 seconds  Total Procedure Duration: 0 hours 11 minutes 53 seconds  Findings:                 The digital rectal exam findings include decreased                            sphincter tone.                           Multiple small-mouthed diverticula were found in  the left colon.                           Retroflexion in the rectum was not performed due to                            anatomy.                           The exam was otherwise without abnormality. Complications:            No immediate complications. Estimated Blood Loss:     Estimated blood loss: none. Impression:               - Decreased sphincter tone found on digital rectal                            exam.                           - Diverticulosis in the left colon.                           - The examination was otherwise normal.                           - No specimens collected. Recommendation:           - Patient has a contact number available for                            emergencies. The signs and symptoms of potential                            delayed complications were discussed with the                            patient. Return to normal activities tomorrow.                             Written discharge instructions were provided to the                            patient.                           - Resume previous diet.                           - Continue present medications.                           - No repeat screening colonoscopy recommended due                            to currernt guidelines, age and the absence of  colonic polyps. Tiyon Sanor L. Loletha Carrow, MD 04/07/2021 9:16:36 AM This report has been signed electronically.

## 2021-04-07 NOTE — Progress Notes (Signed)
Pt's states no medical or surgical changes since previsit or office visit. 

## 2021-04-07 NOTE — Progress Notes (Signed)
pt tolerated well. VSS. awake and to recovery. Report given to RN.  

## 2021-04-07 NOTE — Patient Instructions (Signed)
Resume previous medications.  Diverticulosis.  Handouts on findings given to patient.     YOU HAD AN ENDOSCOPIC PROCEDURE TODAY AT [facility]:   Refer to the procedure report that was given to you for any specific questions about what was found during the examination.  If the procedure report does not answer your questions, please call your gastroenterologist to clarify.  If you requested that your care partner not be given the details of your procedure findings, then the procedure report has been included in a sealed envelope for you to review at your convenience later.  YOU SHOULD EXPECT: Some feelings of bloating in the abdomen. Passage of more gas than usual.  Walking can help get rid of the air that was put into your GI tract during the procedure and reduce the bloating. If you had a lower endoscopy (such as a colonoscopy or flexible sigmoidoscopy) you may notice spotting of blood in your stool or on the toilet paper. If you underwent a bowel prep for your procedure, you may not have a normal bowel movement for a few days.  Please Note:  You might notice some irritation and congestion in your nose or some drainage.  This is from the oxygen used during your procedure.  There is no need for concern and it should clear up in a day or so.  SYMPTOMS TO REPORT IMMEDIATELY:  Following lower endoscopy (colonoscopy or flexible sigmoidoscopy):  Excessive amounts of blood in the stool  Significant tenderness or worsening of abdominal pains  Swelling of the abdomen that is new, acute  Fever of 100F or higher   For urgent or emergent issues, a gastroenterologist can be reached at any hour by calling [phone]. Do not use MyChart messaging for urgent concerns.    DIET:  We do recommend a small meal at first, but then you may proceed to your regular diet.  Drink plenty of fluids but you should avoid alcoholic beverages for 24 hours.  ACTIVITY:  You should plan to take it easy for  the rest of today and you should NOT DRIVE or use heavy machinery until tomorrow (because of the sedation medicines used during the test).    FOLLOW UP: Our staff will call the number listed on your records 48-72 hours following your procedure to check on you and address any questions or concerns that you may have regarding the information given to you following your procedure. If we do not reach you, we will leave a message.  We will attempt to reach you two times.  During this call, we will ask if you have developed any symptoms of COVID 19. If you develop any symptoms (ie: fever, flu-like symptoms, shortness of breath, cough etc.) before then, please call [phone].  If you test positive for Covid 19 in the 2 weeks post procedure, please call and report this information to [place].    If any biopsies were taken you will be contacted by phone or by letter within the next 1-3 weeks.  Please call us at [phone] if you have not heard about the biopsies in 3 weeks.    SIGNATURES/CONFIDENTIALITY: You and/or your care partner have signed paperwork which will be entered into your electronic medical record.  These signatures attest to the fact that that the information above on your After Visit Summary has been reviewed and is understood.  Full responsibility of the confidentiality of this discharge information lies with you and/or your care-partner.

## 2021-04-09 ENCOUNTER — Telehealth: Payer: Self-pay | Admitting: *Deleted

## 2021-04-09 NOTE — Telephone Encounter (Signed)
  Follow up Call-  Call back number 04/07/2021  Post procedure Call Back phone  # [phone]  Permission to leave phone message Yes  Some recent data might be hidden     Patient questions:  Message left to call us if necessary.  Second call.

## 2021-04-09 NOTE — Telephone Encounter (Signed)
  Follow up Call-  Call back number 04/07/2021  Post procedure Call Back phone  # [phone]  Permission to leave phone message Yes  Some recent data might be hidden     Patient questions: Message left to call us if necessary.

## 2021-04-16 ENCOUNTER — Other Ambulatory Visit: Payer: Self-pay | Admitting: Family

## 2021-05-06 ENCOUNTER — Other Ambulatory Visit: Payer: Self-pay | Admitting: Family

## 2021-05-06 DIAGNOSIS — L659 Nonscarring hair loss, unspecified: Secondary | ICD-10-CM | POA: Diagnosis not present

## 2021-05-06 DIAGNOSIS — L669 Cicatricial alopecia, unspecified: Secondary | ICD-10-CM | POA: Diagnosis not present

## 2021-05-20 ENCOUNTER — Other Ambulatory Visit (HOSPITAL_BASED_OUTPATIENT_CLINIC_OR_DEPARTMENT_OTHER): Payer: Self-pay | Admitting: Family

## 2021-05-20 DIAGNOSIS — L669 Cicatricial alopecia, unspecified: Secondary | ICD-10-CM | POA: Diagnosis not present

## 2021-05-20 DIAGNOSIS — Z1231 Encounter for screening mammogram for malignant neoplasm of breast: Secondary | ICD-10-CM

## 2021-05-23 ENCOUNTER — Other Ambulatory Visit: Payer: Self-pay | Admitting: Family

## 2021-06-02 DIAGNOSIS — R059 Cough, unspecified: Secondary | ICD-10-CM | POA: Diagnosis not present

## 2021-06-02 DIAGNOSIS — J069 Acute upper respiratory infection, unspecified: Secondary | ICD-10-CM | POA: Diagnosis not present

## 2021-06-12 ENCOUNTER — Other Ambulatory Visit: Payer: Self-pay | Admitting: Family

## 2021-06-16 ENCOUNTER — Encounter (HOSPITAL_BASED_OUTPATIENT_CLINIC_OR_DEPARTMENT_OTHER): Payer: Self-pay

## 2021-06-16 ENCOUNTER — Ambulatory Visit (HOSPITAL_BASED_OUTPATIENT_CLINIC_OR_DEPARTMENT_OTHER)
Admission: RE | Admit: 2021-06-16 | Discharge: 2021-06-16 | Disposition: A | Discharge status: Home / Self Care | Payer: Medicare Other | Source: Ambulatory Visit | Attending: Family | Admitting: Family

## 2021-06-16 ENCOUNTER — Other Ambulatory Visit: Payer: Self-pay

## 2021-06-16 DIAGNOSIS — Z1231 Encounter for screening mammogram for malignant neoplasm of breast: Secondary | ICD-10-CM | POA: Insufficient documentation

## 2021-06-25 ENCOUNTER — Telehealth: Payer: Self-pay | Admitting: Family

## 2021-06-25 NOTE — Telephone Encounter (Signed)
LVM for pt to rtn our call to schedule AWV with [facility]. Please schedule this appt if pt calls the office.

## 2021-07-01 ENCOUNTER — Other Ambulatory Visit: Payer: Self-pay | Admitting: Family

## 2021-07-08 ENCOUNTER — Other Ambulatory Visit: Payer: Self-pay

## 2021-07-08 ENCOUNTER — Ambulatory Visit (INDEPENDENT_AMBULATORY_CARE_PROVIDER_SITE_OTHER): Payer: Medicare Other

## 2021-07-08 VITALS — BP 112/80 | HR 56 | Temp 97.5°F | Resp 16 | Ht 62.0 in | Wt 180.4 lb

## 2021-07-08 DIAGNOSIS — Z780 Asymptomatic menopausal state: Secondary | ICD-10-CM | POA: Diagnosis not present

## 2021-07-08 DIAGNOSIS — Z0000 Encounter for general adult medical examination without abnormal findings: Secondary | ICD-10-CM | POA: Diagnosis not present

## 2021-07-08 NOTE — Progress Notes (Signed)
Subjective:   Janice Meadows is a 69 y.o. female who presents for Medicare Annual (Subsequent) preventive examination.  Review of Systems     Cardiac Risk Factors include: advanced age (>57mn, >>67women);hypertension;obesity (BMI >30kg/m2);dyslipidemia     Objective:    Today's Vitals   07/08/21 1304  BP: 112/80  Pulse: (!) 56  Resp: 16  Temp: (!) 97.5 F (36.4 C)  TempSrc: Temporal  SpO2: 98%  Weight: 180 lb 6.4 oz (81.8 kg)  Height: '5\' 2"'$  (1.575 m)   Body mass index is 33 kg/m.  Advanced Directives 07/08/2021 02/28/2019 08/06/2017 07/16/2017 05/12/2017 06/11/2015 12/26/2014  Does Patient Have a Medical Advance Directive? No No No No No No No  Would patient like information on creating a medical advance directive? Yes (MAU/Ambulatory/Procedural Areas - Information given) Yes (MAU/Ambulatory/Procedural Areas - Information given) - - Yes (MAU/Ambulatory/Procedural Areas - Information given) No - patient declined information No - patient declined information    Current Medications (verified) Outpatient Encounter Medications as of 07/08/2021  Medication Sig   amLODipine (NORVASC) 5 MG tablet TAKE 1 TABLET(5 MG) BY MOUTH DAILY   Bioflavonoid Products (ESTER C PO) Take 1 tablet by mouth.   Calcium Carbonate-Vitamin D 600-400 MG-UNIT tablet Take 1 tablet by mouth 2 (two) times daily.   gabapentin (NEURONTIN) 300 MG capsule TAKE 1 CAPSULE(300 MG) BY MOUTH AT BEDTIME   hydrochlorothiazide (HYDRODIURIL) 25 MG tablet TAKE 1 TABLET BY MOUTH EVERY DAY   loratadine (CLARITIN) 10 MG tablet Take 1 tablet (10 mg total) by mouth daily.   losartan (COZAAR) 100 MG tablet TAKE 1 TABLET(100 MG) BY MOUTH DAILY   Multiple Vitamin (MULTIVITAMIN) tablet Take 1 tablet by mouth daily. One-a-day womens   potassium chloride SA (KLOR-CON) 20 MEQ tablet TAKE 1 TABLET(20 MEQ) BY MOUTH DAILY   simvastatin (ZOCOR) 40 MG tablet TAKE 1 TABLET(40 MG) BY MOUTH EVERY EVENING   Facility-Administered Encounter  Medications as of 07/08/2021  Medication   0.9 %  sodium chloride infusion    Allergies (verified) Patient has no known allergies.   History: Past Medical History:  Diagnosis Date   Arthritis    back and knees   Cataract    High cholesterol    Hypertension    Neuromuscular disorder (HCC)    neuropathy   Neuropathy    Past Surgical History:  Procedure Laterality Date   ABDOMINAL HYSTERECTOMY     BREAST CYST EXCISION Bilateral    TOOTH EXTRACTION  04/28/2017   Family History  Problem Relation Age of Onset   Hypertension Mother    Hypertension Father    Cancer Sister 421      lymphoma    Hypertension Sister    Kidney disease Daughter    Stroke Daughter    Hypertension Sister    Hypertension Sister    Hypertension Brother    Hypertension Brother    Gout Brother    Hypertension Brother    Hypertension Brother    Colon cancer Neg Hx    Colon polyps Neg Hx    Esophageal cancer Neg Hx    Rectal cancer Neg Hx    Stomach cancer Neg Hx    Social History   Socioeconomic History   Marital status: Single    Spouse name: Not on file   Number of children: Not on file   Years of education: Not on file   Highest education level: Not on file  Occupational History   Not on file  Tobacco  Use   Smoking status: Every Day    Packs/day: 0.50    Years: 22.00    Pack years: 11.00    Types: Cigarettes   Smokeless tobacco: Never   Tobacco comments:    5 cigarettes daily  Vaping Use   Vaping Use: Never used  Substance and Sexual Activity   Alcohol use: Yes    Alcohol/week: 0.0 standard drinks    Comment: occasional   Drug use: No   Sexual activity: Never  Other Topics Concern   Not on file  Social History Narrative   Works in a [employer]   7 children- one deceased shortly after birth   Lives alone but frequently has family visit   Has 7 grandchildren   Enjoys walking, playing grand kids, park   Dog- outdoor dog.     Social Determinants of Health    Financial Resource Strain: Low Risk    Difficulty of Paying Living Expenses: Not hard at all  Food Insecurity: No Food Insecurity   Worried About Charity fundraiser in the Last Year: Never true   [place] in the Last Year: Never true  Transportation Needs: No Transportation Needs   Lack of Transportation (Medical): No   Lack of Transportation (Non-Medical): No  Physical Activity: Insufficiently Active   Days of Exercise per Week: 2 days   Minutes of Exercise per Session: 50 min  Stress: No Stress Concern Present   Feeling of Stress : Not at all  Social Connections: Socially Isolated   Frequency of Communication with Friends and Family: Once a week   Frequency of Social Gatherings with Friends and Family: Once a week   Attends Religious Services: Never   Marine scientist or Organizations: No   Attends Music therapist: Never   Marital Status: Divorced    Tobacco Counseling Ready to quit: Not Answered Counseling given: Not Answered Tobacco comments: 5 cigarettes daily   Clinical Intake:  Pre-visit preparation completed: Yes  Pain : No/denies pain     BMI - recorded: 33 Nutritional Status: BMI > 30  Obese Nutritional Risks: None Diabetes: No  How often do you need to have someone help you when you read instructions, pamphlets, or other written materials from your doctor or pharmacy?: 1 - Never  Diabetic?No  Interpreter Needed?: No  Information entered by :: Caroleen Hamman LPN   Activities of Daily Living In your present state of health, do you have any difficulty performing the following activities: 07/08/2021  Hearing? N  Vision? N  Difficulty concentrating or making decisions? N  Walking or climbing stairs? N  Dressing or bathing? N  Doing errands, shopping? N  Preparing Food and eating ? N  Using the Toilet? N  In the past six months, have you accidently leaked urine? N  Do you have problems with loss of bowel control? N   Managing your Medications? N  Managing your Finances? N  Housekeeping or managing your Housekeeping? N  Some recent data might be hidden    Patient Care Team: Debbrah Alar, NP as PCP - General (Internal Medicine) Nahser, Wonda Cheng, MD as PCP - Cardiology (Cardiology)  Indicate any recent Medical Services you may have received from other than [facility] providers in the past year (date may be approximate).     Assessment:   This is a routine wellness examination for Janice Meadows.  Hearing/Vision screen Hearing Screening - Comments:: No issues Vision Screening - Comments::  Last eye exam-07/2020  Dietary issues and exercise activities discussed: Current Exercise Habits: Home exercise routine, Type of exercise: walking, Time (Minutes): 45, Frequency (Times/Week): 2, Weekly Exercise (Minutes/Week): 90, Intensity: Mild   Goals Addressed             This Visit's Progress    Patient Stated       Drink more water     Quit Smoking   Not on track      Depression Screen PHQ 2/9 Scores 07/08/2021 04/16/2020 02/28/2019 06/22/2018 05/12/2017 09/07/2016 07/31/2016  PHQ - 2 Score 0 0 0 0 0 0 0  PHQ- 9 Score - - - 0 - - -    Fall Risk Fall Risk  07/08/2021 04/16/2020 02/28/2019 06/22/2018 05/12/2017  Falls in the past year? 0 0 0 No No  Number falls in past yr: 0 0 - - -  Injury with Fall? 0 - - - -  Follow up Falls prevention discussed - - - -    FALL RISK PREVENTION PERTAINING TO THE HOME:  Any stairs in or around the home? Yes  If so, are there any without handrails? No  Home free of loose throw rugs in walkways, pet beds, electrical cords, etc? Yes  Adequate lighting in your home to reduce risk of falls? Yes   ASSISTIVE DEVICES UTILIZED TO PREVENT FALLS:  Life alert? No  Use of a cane, walker or w/c? No  Grab bars in the bathroom? Yes  Shower chair or bench in shower? No  Elevated toilet seat or a handicapped toilet? No   TIMED UP AND GO:  Was the test performed? Yes .  Length of  time to ambulate 10 feet: 11 sec.   Gait steady and fast without use of assistive device  Cognitive Function:Normal cognitive status assessed by direct observation by this Nurse Health Advisor. No abnormalities found.          Immunizations Immunization History  Administered Date(s) Administered   Fluad Quad(high Dose 65+) 07/25/2019, 07/17/2020   Influenza, High Dose Seasonal PF 09/14/2017, 08/12/2018   Influenza,inj,Quad PF,6+ Mos 09/09/2015, 07/31/2016   PFIZER Comirnaty(Gray Top)Covid-19 Tri-Sucrose Vaccine 02/13/2021   PFIZER(Purple Top)SARS-COV-2 Vaccination 02/10/2020, 03/05/2020   Pneumococcal Conjugate-13 02/10/2017   Pneumococcal Polysaccharide-23 10/26/2012, 06/22/2018   Tdap 03/04/2015    TDAP status: Up to date  Flu Vaccine status: Due, Education has been provided regarding the importance of this vaccine. Advised may receive this vaccine at local pharmacy or Health Dept. Aware to provide a copy of the vaccination record if obtained from local pharmacy or Health Dept. Verbalized acceptance and understanding.  Pneumococcal vaccine status: Up to date  Covid-19 vaccine status: Completed vaccines  Qualifies for Shingles Vaccine? Yes   Zostavax completed No   Shingrix Completed?: No.    Education has been provided regarding the importance of this vaccine. Patient has been advised to call insurance company to determine out of pocket expense if they have not yet received this vaccine. Advised may also receive vaccine at local pharmacy or Health Dept. Verbalized acceptance and understanding.  Screening Tests Health Maintenance  Topic Date Due   Zoster Vaccines- Shingrix (1 of 2) Never done   INFLUENZA VACCINE  05/26/2021   COVID-19 Vaccine (4 - Booster for [place] [facility] [place] series) 06/15/2021   MAMMOGRAM  06/17/2023   COLONOSCOPY (Pts 45-6yr Insurance coverage will need to be confirmed)  04/07/2024   TETANUS/TDAP  03/03/2025   DEXA SCAN  Completed   Hepatitis C Screening   Completed  PNA vac Low Risk Adult  Completed   HPV VACCINES  Aged Out    Health Maintenance  Health Maintenance Due  Topic Date Due   Zoster Vaccines- Shingrix (1 of 2) Never done   INFLUENZA VACCINE  05/26/2021   COVID-19 Vaccine (4 - Booster for Pf[employer] series) 06/15/2021    Colorectal cancer screening: No longer required.   Mammogram status: Completed bilateral 06/16/2021. Repeat every year  Bone Density status: Ordered today. Pt provided with contact info and advised to call to schedule appt.  Lung Cancer Screening: (Low Dose CT Chest recommended if Age 42-80 years, 30 pack-year currently smoking OR have quit w/in 15years.) does not qualify.     Additional Screening:  Hepatitis C Screening: Completed 09/09/2015  Vision Screening: Recommended annual ophthalmology exams for early detection of glaucoma and other disorders of the eye. Is the patient up to date with their annual eye exam?  Yes  Who is the provider or what is the name of the office in which the patient attends annual eye exams? Dr. Julien Girt   Dental Screening: Recommended annual dental exams for proper oral hygiene  Community Resource Referral / Chronic Care Management: CRR required this visit?  No   CCM required this visit?  No      Plan:     I have personally reviewed and noted the following in the patient's chart:   Medical and social history Use of alcohol, tobacco or illicit drugs  Current medications and supplements including opioid prescriptions.  Functional ability and status Nutritional status Physical activity Advanced directives List of other physicians Hospitalizations, surgeries, and ER visits in previous 12 months Vitals Screenings to include cognitive, depression, and falls Referrals and appointments  In addition, I have reviewed and discussed with patient certain preventive protocols, quality metrics, and best practice recommendations. A written personalized care plan for  preventive services as well as general preventive health recommendations were provided to patient.     Marta Antu, LPN   [phone]  Nurse Health Advisor  Nurse Notes: None

## 2021-07-08 NOTE — Patient Instructions (Signed)
Janice Meadows , Thank you for taking time to come for your Medicare Wellness Visit. I appreciate your ongoing commitment to your health goals. Please review the following plan we discussed and let me know if I can assist you in the future.   Screening recommendations/referrals: Colonoscopy: No longer required Mammogram: Completed 06/16/2021-Due 06/16/2022 Bone Density: Ordered today. Someone will call you to schedule Recommended yearly ophthalmology/optometry visit for glaucoma screening and checkup Recommended yearly dental visit for hygiene and checkup  Vaccinations: Influenza vaccine: Due-May obtain vaccine at our office or your local pharmacy Pneumococcal vaccine: Up to date Tdap vaccine: Up to date-Due 03/03/2025 Shingles vaccine: Discuss with pharmacy   Covid-19:Up to date  Advanced directives: Information given today  Conditions/risks identified:  See problem list  Next appointment: Follow up in one year for your annual wellness visit 07/13/2022 @ 8:20.   Preventive Care 24 Years and Older, Female Preventive care refers to lifestyle choices and visits with your health care provider that can promote health and wellness. What does preventive care include? A yearly physical exam. This is also called an annual well check. Dental exams once or twice a year. Routine eye exams. Ask your health care provider how often you should have your eyes checked. Personal lifestyle choices, including: Daily care of your teeth and gums. Regular physical activity. Eating a healthy diet. Avoiding tobacco and drug use. Limiting alcohol use. Practicing safe sex. Taking low-dose aspirin every day. Taking vitamin and mineral supplements as recommended by your health care provider. What happens during an annual well check? The services and screenings done by your health care provider during your annual well check will depend on your age, overall health, lifestyle risk factors, and family history of  disease. Counseling  Your health care provider may ask you questions about your: Alcohol use. Tobacco use. Drug use. Emotional well-being. Home and relationship well-being. Sexual activity. Eating habits. History of falls. Memory and ability to understand (cognition). Work and work Statistician. Reproductive health. Screening  You may have the following tests or measurements: Height, weight, and BMI. Blood pressure. Lipid and cholesterol levels. These may be checked every 5 years, or more frequently if you are over 43 years old. Skin check. Lung cancer screening. You may have this screening every year starting at age 74 if you have a 30-pack-year history of smoking and currently smoke or have quit within the past 15 years. Fecal occult blood test (FOBT) of the stool. You may have this test every year starting at age 71. Flexible sigmoidoscopy or colonoscopy. You may have a sigmoidoscopy every 5 years or a colonoscopy every 10 years starting at age 74. Hepatitis C blood test. Hepatitis B blood test. Sexually transmitted disease (STD) testing. Diabetes screening. This is done by checking your blood sugar (glucose) after you have not eaten for a while (fasting). You may have this done every 1-3 years. Bone density scan. This is done to screen for osteoporosis. You may have this done starting at age 35. Mammogram. This may be done every 1-2 years. Talk to your health care provider about how often you should have regular mammograms. Talk with your health care provider about your test results, treatment options, and if necessary, the need for more tests. Vaccines  Your health care provider may recommend certain vaccines, such as: Influenza vaccine. This is recommended every year. Tetanus, diphtheria, and acellular pertussis (Tdap, Td) vaccine. You may need a Td booster every 10 years. Zoster vaccine. You may need this after age  60. Pneumococcal 13-valent conjugate (PCV13) vaccine. One  dose is recommended after age 12. Pneumococcal polysaccharide (PPSV23) vaccine. One dose is recommended after age 80. Talk to your health care provider about which screenings and vaccines you need and how often you need them. This information is not intended to replace advice given to you by your health care provider. Make sure you discuss any questions you have with your health care provider. Document Released: 11/08/2015 Document Revised: 07/01/2016 Document Reviewed: 08/13/2015 Elsevier Interactive Patient Education  2017 [employer] Prevention in the Home Falls can cause injuries. They can happen to people of all ages. There are many things you can do to make your home safe and to help prevent falls. What can I do on the outside of my home? Regularly fix the edges of walkways and driveways and fix any cracks. Remove anything that might make you trip as you walk through a door, such as a raised step or threshold. Trim any bushes or trees on the path to your home. Use bright outdoor lighting. Clear any walking paths of anything that might make someone trip, such as rocks or tools. Regularly check to see if handrails are loose or broken. Make sure that both sides of any steps have handrails. Any raised decks and porches should have guardrails on the edges. Have any leaves, snow, or ice cleared regularly. Use sand or salt on walking paths during winter. Clean up any spills in your garage right away. This includes oil or grease spills. What can I do in the bathroom? Use night lights. Install grab bars by the toilet and in the tub and shower. Do not use towel bars as grab bars. Use non-skid mats or decals in the tub or shower. If you need to sit down in the shower, use a plastic, non-slip stool. Keep the floor dry. Clean up any water that spills on the floor as soon as it happens. Remove soap buildup in the tub or shower regularly. Attach bath mats securely with double-sided  non-slip rug tape. Do not have throw rugs and other things on the floor that can make you trip. What can I do in the bedroom? Use night lights. Make sure that you have a light by your bed that is easy to reach. Do not use any sheets or blankets that are too big for your bed. They should not hang down onto the floor. Have a firm chair that has side arms. You can use this for support while you get dressed. Do not have throw rugs and other things on the floor that can make you trip. What can I do in the kitchen? Clean up any spills right away. Avoid walking on wet floors. Keep items that you use a lot in easy-to-reach places. If you need to reach something above you, use a strong step stool that has a grab bar. Keep electrical cords out of the way. Do not use floor polish or wax that makes floors slippery. If you must use wax, use non-skid floor wax. Do not have throw rugs and other things on the floor that can make you trip. What can I do with my stairs? Do not leave any items on the stairs. Make sure that there are handrails on both sides of the stairs and use them. Fix handrails that are broken or loose. Make sure that handrails are as long as the stairways. Check any carpeting to make sure that it is firmly attached to the stairs. Fix  any carpet that is loose or worn. Avoid having throw rugs at the top or bottom of the stairs. If you do have throw rugs, attach them to the floor with carpet tape. Make sure that you have a light switch at the top of the stairs and the bottom of the stairs. If you do not have them, ask someone to add them for you. What else can I do to help prevent falls? Wear shoes that: Do not have high heels. Have rubber bottoms. Are comfortable and fit you well. Are closed at the toe. Do not wear sandals. If you use a stepladder: Make sure that it is fully opened. Do not climb a closed stepladder. Make sure that both sides of the stepladder are locked into place. Ask  someone to hold it for you, if possible. Clearly mark and make sure that you can see: Any grab bars or handrails. First and last steps. Where the edge of each step is. Use tools that help you move around (mobility aids) if they are needed. These include: Canes. Walkers. Scooters. Crutches. Turn on the lights when you go into a dark area. Replace any light bulbs as soon as they burn out. Set up your furniture so you have a clear path. Avoid moving your furniture around. If any of your floors are uneven, fix them. If there are any pets around you, be aware of where they are. Review your medicines with your doctor. Some medicines can make you feel dizzy. This can increase your chance of falling. Ask your doctor what other things that you can do to help prevent falls. This information is not intended to replace advice given to you by your health care provider. Make sure you discuss any questions you have with your health care provider. Document Released: 08/08/2009 Document Revised: 03/19/2016 Document Reviewed: 11/16/2014 Elsevier Interactive Patient Education  2017 [employer].

## 2021-07-17 DIAGNOSIS — L661 Lichen planopilaris: Secondary | ICD-10-CM | POA: Diagnosis not present

## 2021-07-28 ENCOUNTER — Other Ambulatory Visit: Payer: Self-pay

## 2021-07-28 ENCOUNTER — Ambulatory Visit (HOSPITAL_BASED_OUTPATIENT_CLINIC_OR_DEPARTMENT_OTHER)
Admission: RE | Admit: 2021-07-28 | Discharge: 2021-07-28 | Disposition: A | Discharge status: Home / Self Care | Payer: Medicare Other | Source: Ambulatory Visit | Attending: Family | Admitting: Family

## 2021-07-28 DIAGNOSIS — M85851 Other specified disorders of bone density and structure, right thigh: Secondary | ICD-10-CM | POA: Diagnosis not present

## 2021-07-28 DIAGNOSIS — Z780 Asymptomatic menopausal state: Secondary | ICD-10-CM | POA: Diagnosis not present

## 2021-07-31 ENCOUNTER — Encounter: Payer: Self-pay | Admitting: Family

## 2021-08-13 ENCOUNTER — Other Ambulatory Visit: Payer: Self-pay | Admitting: Family

## 2021-08-13 ENCOUNTER — Ambulatory Visit (INDEPENDENT_AMBULATORY_CARE_PROVIDER_SITE_OTHER): Payer: Medicare Other | Admitting: Family

## 2021-08-13 ENCOUNTER — Other Ambulatory Visit: Payer: Self-pay

## 2021-08-13 VITALS — BP 117/68 | HR 66 | Temp 98.2°F | Resp 18 | Ht 62.0 in | Wt 182.8 lb

## 2021-08-13 DIAGNOSIS — G629 Polyneuropathy, unspecified: Secondary | ICD-10-CM | POA: Diagnosis not present

## 2021-08-13 DIAGNOSIS — Z720 Tobacco use: Secondary | ICD-10-CM

## 2021-08-13 DIAGNOSIS — E785 Hyperlipidemia, unspecified: Secondary | ICD-10-CM

## 2021-08-13 DIAGNOSIS — I10 Essential (primary) hypertension: Secondary | ICD-10-CM | POA: Diagnosis not present

## 2021-08-13 DIAGNOSIS — Z23 Encounter for immunization: Secondary | ICD-10-CM

## 2021-08-13 LAB — COMPREHENSIVE METABOLIC PANEL
ALT: 9 U/L (ref 0–35)
AST: 19 U/L (ref 0–37)
Albumin: 3.5 g/dL (ref 3.5–5.2)
Alkaline Phosphatase: 81 U/L (ref 39–117)
BUN: 15 mg/dL (ref 6–23)
CO2: 28 mEq/L (ref 19–32)
Calcium: 9.2 mg/dL (ref 8.4–10.5)
Chloride: 106 mEq/L (ref 96–112)
Creatinine, Ser: 0.95 mg/dL (ref 0.40–1.20)
GFR: 61.01 mL/min (ref >60.00)
Glucose, Bld: 90 mg/dL (ref 70–99)
Potassium: 3.9 mEq/L (ref 3.5–5.1)
Sodium: 141 mEq/L (ref 135–145)
Total Bilirubin: 0.4 mg/dL (ref 0.2–1.2)
Total Protein: 6.5 g/dL (ref 6.0–8.3)

## 2021-08-13 LAB — LIPID PANEL
Cholesterol: 158 mg/dL (ref 0–200)
HDL: 74.5 mg/dL
LDL Cholesterol: 72 mg/dL (ref 0–99)
NonHDL: 83.61
Total CHOL/HDL Ratio: 2
Triglycerides: 57 mg/dL (ref 0.0–149.0)
VLDL: 11.4 mg/dL (ref 0.0–40.0)

## 2021-08-13 MED ORDER — HYDROCHLOROTHIAZIDE 25 MG PO TABS: 25.0000 mg | ORAL_TABLET | Freq: Every day | ORAL | 1 refills | Status: DC

## 2021-08-13 MED ORDER — AMLODIPINE BESYLATE 5 MG PO TABS: ORAL_TABLET | ORAL | 1 refills | Status: DC

## 2021-08-13 MED ORDER — LOSARTAN POTASSIUM 100 MG PO TABS: ORAL_TABLET | ORAL | 1 refills | Status: DC

## 2021-08-13 NOTE — Assessment & Plan Note (Signed)
Tolerating simvastatin 40mg . Continue same, obtain follow up lipid panel.

## 2021-08-13 NOTE — Patient Instructions (Signed)
Please complete lab work prior to leaving.   

## 2021-08-13 NOTE — Assessment & Plan Note (Signed)
BP at goal.  Continue current medications.

## 2021-08-13 NOTE — Assessment & Plan Note (Signed)
Reports that she has cut back.  Encouraged complete cessation.

## 2021-08-13 NOTE — Assessment & Plan Note (Signed)
Stable. Continue gabapentin 300mg  HS.

## 2021-08-13 NOTE — Progress Notes (Signed)
Subjective:     Patient ID: Janice Meadows, female    DOB: 04/12/1951, 70 y.o.   MRN: [id number]  No chief complaint on file.   HPI Patient is in today for follow up.  HTN- maintained on amlodipine 39m, losartan 100 mg,  hctz 25 mg.  BP Readings from Last 3 Encounters:  08/13/21 117/68  07/08/21 112/80  04/07/21 110/70   Hyperlipidemia- maintained on simvastatin 481m   Lab Results  Component Value Date   CHOL 158 04/16/2020   HDL 66.90 04/16/2020   LDLCALC 81 04/16/2020   TRIG 47.0 04/16/2020   CHOLHDL 2 04/16/2020     Health Maintenance Due  Topic Date Due   Zoster Vaccines- Shingrix (1 of 2) Never done   INFLUENZA VACCINE  05/26/2021   COVID-19 Vaccine (4 - Booster for PfCoca-Colaeries) 06/15/2021    Past Medical History:  Diagnosis Date   Arthritis    back and knees   Cataract    High cholesterol    Hypertension    Neuromuscular disorder (HCC)    neuropathy   Neuropathy     Past Surgical History:  Procedure Laterality Date   ABDOMINAL HYSTERECTOMY     BREAST CYST EXCISION Bilateral    TOOTH EXTRACTION  04/28/2017    Family History  Problem Relation Age of Onset   Hypertension Mother    Hypertension Father    Cancer Sister [id number]     lymphoma    Hypertension Sister    Kidney disease Daughter    Stroke Daughter    Hypertension Sister    Hypertension Sister    Hypertension Brother    Hypertension Brother    Gout Brother    Hypertension Brother    Hypertension Brother    Colon cancer Neg Hx    Colon polyps Neg Hx    Esophageal cancer Neg Hx    Rectal cancer Neg Hx    Stomach cancer Neg Hx     Social History   Socioeconomic History   Marital status: Single    Spouse name: Not on file   Number of children: Not on file   Years of education: Not on file   Highest education level: Not on file  Occupational History   Not on file  Tobacco Use   Smoking status: Every Day    Packs/day: 0.50    Years: 22.00    Pack years: 11.00    Types:  Cigarettes   Smokeless tobacco: Never   Tobacco comments:    5 cigarettes daily  Vaping Use   Vaping Use: Never used  Substance and Sexual Activity   Alcohol use: Yes    Alcohol/week: 0.0 standard drinks    Comment: occasional   Drug use: No   Sexual activity: Never  Other Topics Concern   Not on file  Social History Narrative   [place] in a [place] 7 children- one deceased shortly after birth   Lives alone but frequently has family visit   Has 7 grandchildren   Enjoys walking, playing grand kids, park   Dog- outdoor dog.     Social Determinants of Health   Financial Resource Strain: Low Risk    Difficulty of Paying Living Expenses: Not hard at all  Food Insecurity: No Food Insecurity   Worried About RuCharity fundraisern the Last Year: Never true   Ran Out of Food in the Last Year: Never true  Transportation Needs:  No Transportation Needs   Lack of Transportation (Medical): No   Lack of Transportation (Non-Medical): No  Physical Activity: Insufficiently Active   Days of Exercise per Week: 2 days   Minutes of Exercise per Session: 50 min  Stress: No Stress Concern Present   Feeling of Stress : Not at all  Social Connections: Socially Isolated   Frequency of Communication with Friends and Family: Once a week   Frequency of Social Gatherings with Friends and Family: Once a week   Attends Religious Services: Never   Marine scientist or Organizations: No   Attends Music therapist: Never   Marital Status: Divorced  Human resources officer Violence: Not At Risk   Fear of Current or Ex-Partner: No   Emotionally Abused: No   Physically Abused: No   Sexually Abused: No    Outpatient Medications Prior to Visit  Medication Sig Dispense Refill   Bioflavonoid Products (ESTER C PO) Take 1 tablet by mouth.     Calcium Carbonate-Vitamin D 600-400 MG-UNIT tablet Take 1 tablet by mouth 2 (two) times daily.     gabapentin (NEURONTIN) 300 MG capsule  TAKE 1 CAPSULE(300 MG) BY MOUTH AT BEDTIME 90 capsule 1   loratadine (CLARITIN) 10 MG tablet Take 1 tablet (10 mg total) by mouth daily. 90 tablet 4   Multiple Vitamin (MULTIVITAMIN) tablet Take 1 tablet by mouth daily. One-a-day womens     potassium chloride SA (KLOR-CON) 20 MEQ tablet TAKE 1 TABLET(20 MEQ) BY MOUTH DAILY 90 tablet 1   simvastatin (ZOCOR) 40 MG tablet TAKE 1 TABLET(40 MG) BY MOUTH EVERY EVENING 90 tablet 1   amLODipine (NORVASC) 5 MG tablet TAKE 1 TABLET(5 MG) BY MOUTH DAILY 30 tablet 3   hydrochlorothiazide (HYDRODIURIL) 25 MG tablet TAKE 1 TABLET BY MOUTH EVERY DAY 90 tablet 1   losartan (COZAAR) 100 MG tablet TAKE 1 TABLET(100 MG) BY MOUTH DAILY 90 tablet 1   Facility-Administered Medications Prior to Visit  Medication Dose Route Frequency Provider Last Rate Last Admin   0.9 %  sodium chloride infusion  500 mL Intravenous Once Nelida Meuse III, MD        No Known Allergies  Review of Systems  Cardiovascular:  Positive for leg swelling (mild LE edema after prolonged sitting).      Objective:    Physical Exam Constitutional:      Appearance: She is well-developed.  Cardiovascular:     Rate and Rhythm: Normal rate and regular rhythm.     Heart sounds: Normal heart sounds. No murmur heard. Pulmonary:     Effort: Pulmonary effort is normal. No respiratory distress.     Breath sounds: Normal breath sounds. No wheezing.  Psychiatric:        Behavior: Behavior normal.        Thought Content: Thought content normal.        Judgment: Judgment normal.    BP 117/68   Pulse 66   Temp 98.2 F (36.8 C)   Resp 18   Ht '5\' 2"'  (1.575 m)   Wt 182 lb 12.8 oz (82.9 kg)   SpO2 98%   BMI 33.43 kg/m  Wt Readings from Last 3 Encounters:  08/13/21 182 lb 12.8 oz (82.9 kg)  07/08/21 180 lb 6.4 oz (81.8 kg)  04/07/21 182 lb (82.6 kg)       Assessment & Plan:   Problem List Items Addressed This Visit       Unprioritized   Tobacco abuse  Reports that she has cut  back.  Encouraged complete cessation.       Neuropathy    Stable. Continue gabapentin 334m HS.       Hyperlipidemia    Tolerating simvastatin 441m Continue same, obtain follow up lipid panel.       Relevant Medications   amLODipine (NORVASC) 5 MG tablet   hydrochlorothiazide (HYDRODIURIL) 25 MG tablet   losartan (COZAAR) 100 MG tablet   Other Relevant Orders   Comp Met (CMET)   Lipid panel   Essential hypertension    BP at goal. Continue current medications.       Relevant Medications   amLODipine (NORVASC) 5 MG tablet   hydrochlorothiazide (HYDRODIURIL) 25 MG tablet   losartan (COZAAR) 100 MG tablet   Other Relevant Orders   Comp Met (CMET)   Other Visit Diagnoses     Need for influenza vaccination    -  Primary   Relevant Orders   Flu Vaccine QUAD High Dose(Fluad)       I have changed Clea Lurry's hydrochlorothiazide. I am also having her maintain her multivitamin, Calcium Carbonate-Vitamin D, loratadine, Bioflavonoid Products (ESTER C PO), potassium chloride SA, gabapentin, simvastatin, amLODipine, and losartan. We will continue to administer sodium chloride.  Meds ordered this encounter  Medications   amLODipine (NORVASC) 5 MG tablet    Sig: TAKE 1 TABLET(5 MG) BY MOUTH DAILY    Dispense:  90 tablet    Refill:  1    Order Specific Question:   Supervising Provider    Answer:   BLPenni Homans [[id number]]   hydrochlorothiazide (HYDRODIURIL) 25 MG tablet    Sig: Take 1 tablet (25 mg total) by mouth daily.    Dispense:  90 tablet    Refill:  1    Order Specific Question:   Supervising Provider    Answer:   BLPenni Homans [[id number]]   losartan (COZAAR) 100 MG tablet    Sig: TAKE 1 TABLET(100 MG) BY MOUTH DAILY    Dispense:  90 tablet    Refill:  1    Order Specific Question:   Supervising Provider    Answer:   BLPenni Homans [[id number]]

## 2021-08-15 ENCOUNTER — Encounter: Payer: Self-pay | Admitting: Family

## 2021-08-15 NOTE — Progress Notes (Signed)
Mailed out to patient 

## 2021-09-26 ENCOUNTER — Ambulatory Visit: Payer: Medicare Other | Attending: Internal Medicine

## 2021-09-26 DIAGNOSIS — Z23 Encounter for immunization: Secondary | ICD-10-CM

## 2021-09-26 NOTE — Progress Notes (Signed)
   Covid-19 Vaccination Clinic  Name:  Janice Meadows    MRN: [id number] DOB: January 13, 1951  09/26/2021  [name] was observed post Covid-19 immunization for 15 minutes without incident. She was provided with Vaccine Information Sheet and instruction to access the V-Safe system.   [name] was instructed to call 911 with any severe reactions post vaccine: Difficulty breathing  Swelling of face and throat  A fast heartbeat  A bad rash all over body  Dizziness and weakness   Immunizations Administered     Name Date Dose VIS Date Route   Pfizer Covid-19 Vaccine Bivalent Booster 09/26/2021 10:01 AM 0.3 mL 06/25/2021 Intramuscular   Manufacturer: [employer]   Lot: [id number]   [place]: [phone]

## 2021-09-27 ENCOUNTER — Other Ambulatory Visit (HOSPITAL_BASED_OUTPATIENT_CLINIC_OR_DEPARTMENT_OTHER): Payer: Self-pay

## 2021-09-27 MED ORDER — PFIZER COVID-19 VAC BIVALENT 30 MCG/0.3ML IM SUSP
INTRAMUSCULAR | 0 refills | Status: DC
  Filled 2021-09-27: qty 0.3, 1d supply, fill #0

## 2021-09-29 ENCOUNTER — Other Ambulatory Visit (HOSPITAL_BASED_OUTPATIENT_CLINIC_OR_DEPARTMENT_OTHER): Payer: Self-pay

## 2021-09-29 DIAGNOSIS — M1711 Unilateral primary osteoarthritis, right knee: Secondary | ICD-10-CM | POA: Diagnosis not present

## 2021-10-14 DIAGNOSIS — R059 Cough, unspecified: Secondary | ICD-10-CM | POA: Diagnosis not present

## 2021-10-14 DIAGNOSIS — I10 Essential (primary) hypertension: Secondary | ICD-10-CM | POA: Diagnosis not present

## 2021-10-14 DIAGNOSIS — Z20822 Contact with and (suspected) exposure to covid-19: Secondary | ICD-10-CM | POA: Diagnosis not present

## 2021-10-14 DIAGNOSIS — R6883 Chills (without fever): Secondary | ICD-10-CM | POA: Diagnosis not present

## 2021-11-13 ENCOUNTER — Other Ambulatory Visit: Payer: Self-pay | Admitting: Family

## 2022-02-07 ENCOUNTER — Emergency Department (HOSPITAL_BASED_OUTPATIENT_CLINIC_OR_DEPARTMENT_OTHER)
Admission: EM | Admit: 2022-02-07 | Discharge: 2022-02-07 | Disposition: A | Discharge status: Home / Self Care | Payer: Medicare Other | Attending: Emergency Medicine | Admitting: Emergency Medicine

## 2022-02-07 ENCOUNTER — Encounter (HOSPITAL_BASED_OUTPATIENT_CLINIC_OR_DEPARTMENT_OTHER): Payer: Self-pay

## 2022-02-07 ENCOUNTER — Other Ambulatory Visit: Payer: Self-pay

## 2022-02-07 DIAGNOSIS — J069 Acute upper respiratory infection, unspecified: Secondary | ICD-10-CM | POA: Diagnosis not present

## 2022-02-07 DIAGNOSIS — Z20822 Contact with and (suspected) exposure to covid-19: Secondary | ICD-10-CM | POA: Diagnosis not present

## 2022-02-07 DIAGNOSIS — B9789 Other viral agents as the cause of diseases classified elsewhere: Secondary | ICD-10-CM | POA: Diagnosis not present

## 2022-02-07 DIAGNOSIS — R059 Cough, unspecified: Secondary | ICD-10-CM | POA: Diagnosis present

## 2022-02-07 LAB — RESP PANEL BY RT-PCR (FLU A&B, COVID) ARPGX2
Influenza A by PCR: NEGATIVE
Influenza B by PCR: NEGATIVE
SARS Coronavirus 2 by RT PCR: NEGATIVE

## 2022-02-07 MED ORDER — BENZONATATE 100 MG PO CAPS: 100.0000 mg | ORAL_CAPSULE | Freq: Three times a day (TID) | ORAL | 0 refills | Status: DC

## 2022-02-07 MED ORDER — ALBUTEROL SULFATE HFA 108 (90 BASE) MCG/ACT IN AERS: 1.0000 | INHALATION_SPRAY | Freq: Four times a day (QID) | RESPIRATORY_TRACT | 0 refills | Status: DC | PRN

## 2022-02-07 NOTE — Discharge Instructions (Addendum)
You likely have a viral respiratory infection causing her cough, and this should resolve on its own in about 5 days.  I sent you a medication that should help with some of your cough symptoms as well as an inhaler.  For your sore throat, you can suck on lozenges at home, have warm drinks and use salt water gargles.  Again, symptoms should improve on its own in a few days however if it worsens and you have any difficulty breathing, you can return to the ED for evaluation. ?

## 2022-02-07 NOTE — ED Triage Notes (Signed)
Pt arrives ambulatory to ED with c/o cough over last few days states that she has been taking alka seltzer with cough getting worse last night. Cough is productive with white/yellow sputum.  ?

## 2022-02-07 NOTE — ED Provider Notes (Signed)
?[facility] ?Provider Note ? ? ?CSN: [id number] ?Arrival date & time: 02/07/22  1101 ? ?  ? ?History ? ?Chief Complaint  ?Patient presents with  ? Cough  ? ? ?Janice Meadows is a 71 y.o. female who presents to the ED for evaluation of cough over the last 2 days that does not seem to be improving.  She denies fever, congestion.  She notes that she been taking Alka-Seltzer although is having difficulty sleeping due to cough last night.  She denies runny nose, sore throat.  She denies difficulty breathing, chest pain, abdominal pain, nausea, vomiting, diarrhea ? ? ?Cough ? ?  ? ?Home Medications ?Prior to Admission medications   ?Medication Sig Start Date End Date Taking? Authorizing Provider  ?albuterol (VENTOLIN HFA) 108 (90 Base) MCG/ACT inhaler Inhale 1-2 puffs into the lungs every 6 (six) hours as needed for wheezing or shortness of breath. 02/07/22  Yes Kathe Becton R, PA-C  ?benzonatate (TESSALON) 100 MG capsule Take 1 capsule (100 mg total) by mouth every 8 (eight) hours. 02/07/22  Yes Kathe Becton R, PA-C  ?amLODipine (NORVASC) 5 MG tablet TAKE 1 TABLET(5 MG) BY MOUTH DAILY 08/13/21   Debbrah Alar, NP  ?Bioflavonoid Products (ESTER C PO) Take 1 tablet by mouth.    [provider]  ?Calcium Carbonate-Vitamin D 600-400 MG-UNIT tablet Take 1 tablet by mouth 2 (two) times daily. 06/22/18   Debbrah Alar, NP  ?COVID-19 mRNA bivalent vaccine, Pfizer, (PFIZER COVID-19 VAC BIVALENT) injection Inject into the muscle. 09/26/21   Carlyle Basques, MD  ?gabapentin (NEURONTIN) 300 MG capsule TAKE 1 CAPSULE(300 MG) BY MOUTH AT BEDTIME 06/13/21   Debbrah Alar, NP  ?hydrochlorothiazide (HYDRODIURIL) 25 MG tablet Take 1 tablet (25 mg total) by mouth daily. 08/13/21   Debbrah Alar, NP  ?loratadine (CLARITIN) 10 MG tablet Take 1 tablet (10 mg total) by mouth daily. 07/24/20   Debbrah Alar, NP  ?losartan (COZAAR) 100 MG tablet TAKE 1 TABLET(100 MG) BY MOUTH DAILY  08/13/21   Debbrah Alar, NP  ?Multiple Vitamin (MULTIVITAMIN) tablet Take 1 tablet by mouth daily. One-a-day womens    [provider]  ?potassium chloride SA (KLOR-CON M) 20 MEQ tablet TAKE 1 TABLET(20 MEQ) BY MOUTH DAILY 11/13/21   Debbrah Alar, NP  ?simvastatin (ZOCOR) 40 MG tablet TAKE 1 TABLET(40 MG) BY MOUTH EVERY EVENING 07/01/21   Debbrah Alar, NP  ?   ? ?Allergies    ?Patient has no known allergies.   ? ?Review of Systems   ?Review of Systems  ?Respiratory:  Positive for cough.   ? ?Physical Exam ?Updated Vital Signs ?BP (!) 151/82 (BP Location: Left Arm)   Pulse 74   Temp 98.3 ?F (36.8 ?C) (Oral)   Resp 16   Ht '5\' 1"'$  (1.549 m)   Wt 87.1 kg   SpO2 98%   BMI 36.28 kg/m?  ?Physical Exam ?Vitals and nursing note reviewed.  ?Constitutional:   ?   General: She is not in acute distress. ?   Appearance: She is not ill-appearing.  ?HENT:  ?   Head: Atraumatic.  ?   Mouth/Throat:  ?   Comments: Oropharynx clear, without erythema or lesions ?Eyes:  ?   Conjunctiva/sclera: Conjunctivae normal.  ?Cardiovascular:  ?   Rate and Rhythm: Normal rate and regular rhythm.  ?   Pulses: Normal pulses.  ?   Heart sounds: No murmur heard. ?Pulmonary:  ?   Effort: Pulmonary effort is normal. No respiratory distress.  ?  Breath sounds: Normal breath sounds.  ?   Comments: Lungs CTA bilaterally, no wheezes rales or rhonchi ?Abdominal:  ?   General: Abdomen is flat. There is no distension.  ?   Palpations: Abdomen is soft.  ?   Tenderness: There is no abdominal tenderness.  ?Musculoskeletal:     ?   General: Normal range of motion.  ?   Cervical back: Normal range of motion.  ?Skin: ?   General: Skin is warm and dry.  ?   Capillary Refill: Capillary refill takes less than 2 seconds.  ?Neurological:  ?   General: No focal deficit present.  ?   Mental Status: She is alert.  ?Psychiatric:     ?   Mood and Affect: Mood normal.  ? ? ?ED Results / Procedures / Treatments   ?Labs ?(all labs ordered are  listed, but only abnormal results are displayed) ?Labs Reviewed  ?RESP PANEL BY RT-PCR (FLU A&B, COVID) [id number]  ? ? ?EKG ?None ? ?Radiology ?No results found. ? ?Procedures ?Procedures  ? ? ?Medications Ordered in ED ?Medications - No data to display ? ?ED Course/ Medical Decision Making/ A&P ?  ?                        ?Medical Decision Making ?Risk ?Prescription drug management. ? ? ?22-year-old female in no acute distress, nontoxic-appearing presents to the ED for evaluation of cough for 2 days.  Vitals are normal.  Physical exam benign aside from mild nasal congestion.  Lungs CTA bilaterally.  I ordered interpreted labs to include respiratory panel which was negative for COVID and flu.  Patient can be treated symptomatically with supportive care measures at home.  Provided prescription for albuterol inhaler and Tessalon Perles.  She is to follow-up with her PCP if symptoms do not improve within a week.  Discharged home in good condition. ?Final Clinical Impression(s) / ED Diagnoses ?Final diagnoses:  ?Viral URI with cough  ? ? ?Rx / DC Orders ?ED Discharge Orders   ? ?      Ordered  ?  benzonatate (TESSALON) 100 MG capsule  Every 8 hours       ? 02/07/22 1220  ?  albuterol (VENTOLIN HFA) 108 (90 Base) MCG/ACT inhaler  Every 6 hours PRN       ? 02/07/22 1220  ? ?  ?  ? ?  ? ? ?  ?Tonye Pearson, [place] ?02/07/22 1904 ? ?  ?Lorelle Gibbs, DO ?02/08/22 1200 ? ?

## 2022-02-13 ENCOUNTER — Other Ambulatory Visit: Payer: Self-pay | Admitting: Family

## 2022-03-30 ENCOUNTER — Other Ambulatory Visit: Payer: Self-pay | Admitting: Family

## 2022-04-01 ENCOUNTER — Other Ambulatory Visit: Payer: Self-pay | Admitting: Family

## 2022-04-01 DIAGNOSIS — M1711 Unilateral primary osteoarthritis, right knee: Secondary | ICD-10-CM | POA: Diagnosis not present

## 2022-04-01 DIAGNOSIS — G8929 Other chronic pain: Secondary | ICD-10-CM | POA: Diagnosis not present

## 2022-04-26 ENCOUNTER — Other Ambulatory Visit: Payer: Self-pay | Admitting: Family

## 2022-04-27 NOTE — Telephone Encounter (Signed)
Please contact pt to schedule follow up.  

## 2022-04-27 NOTE — Telephone Encounter (Signed)
Patient scheduled to come in Friday

## 2022-05-01 ENCOUNTER — Ambulatory Visit (INDEPENDENT_AMBULATORY_CARE_PROVIDER_SITE_OTHER): Payer: Medicare Other | Admitting: Family

## 2022-05-01 VITALS — BP 127/78 | HR 72 | Temp 98.5°F | Resp 16 | Wt 182.0 lb

## 2022-05-01 DIAGNOSIS — E785 Hyperlipidemia, unspecified: Secondary | ICD-10-CM

## 2022-05-01 DIAGNOSIS — Z1231 Encounter for screening mammogram for malignant neoplasm of breast: Secondary | ICD-10-CM | POA: Diagnosis not present

## 2022-05-01 DIAGNOSIS — Z720 Tobacco use: Secondary | ICD-10-CM

## 2022-05-01 DIAGNOSIS — I10 Essential (primary) hypertension: Secondary | ICD-10-CM | POA: Diagnosis not present

## 2022-05-01 LAB — BASIC METABOLIC PANEL
BUN: 14 mg/dL (ref 6–23)
CO2: 29 mEq/L (ref 19–32)
Calcium: 9.4 mg/dL (ref 8.4–10.5)
Chloride: 105 mEq/L (ref 96–112)
Creatinine, Ser: 0.86 mg/dL (ref 0.40–1.20)
GFR: 68.41 mL/min (ref >60.00)
Glucose, Bld: 89 mg/dL (ref 70–99)
Potassium: 4.2 mEq/L (ref 3.5–5.1)
Sodium: 141 mEq/L (ref 135–145)

## 2022-05-01 LAB — LIPID PANEL
Cholesterol: 179 mg/dL (ref 0–200)
HDL: 76.9 mg/dL (ref >39.00)
LDL Cholesterol: 90 mg/dL (ref 0–99)
NonHDL: 102.33
Total CHOL/HDL Ratio: 2
Triglycerides: 62 mg/dL (ref 0.0–149.0)
VLDL: 12.4 mg/dL (ref 0.0–40.0)

## 2022-05-01 NOTE — Assessment & Plan Note (Signed)
Encouraged complete cessation. 

## 2022-05-01 NOTE — Progress Notes (Signed)
Subjective:   By signing my name below, I, Kellie Simmering, attest that this documentation has been prepared under the direction and in the presence of Debbrah Alar, NP  05/01/2022     Patient ID: Janice Meadows, female    DOB: 06-04-1951, 71 y.o.   MRN: [id number]  Chief Complaint  Patient presents with   Hypertension    Here for follow up    HPI Patient is in today for an office visit.  Blood pressure- She is currently taking amlodipine 5 mg, losartan 100 mg and hydrochlorothiazide 25 mg to manage her blood pressure. Her blood pressure is within normal ranges. BP Readings from Last 3 Encounters:  05/01/22 127/78  02/07/22 (!) 151/82  08/13/21 117/68   Cholesterol- She is curently taking simvastatin 40 mg to manage her cholesterol and reports no issues.  Allergies- Her allergies have been well managed. She is only taking Claritin when needed.   Smoking- She has reduced her smoking to 3 cigarettes a day.  Past Medical History:  Diagnosis Date   Arthritis    back and knees   Cataract    High cholesterol    Hypertension    Neuromuscular disorder ([place])    neuropathy   Neuropathy     Past Surgical History:  Procedure Laterality Date   ABDOMINAL HYSTERECTOMY     BREAST CYST EXCISION Bilateral    TOOTH EXTRACTION  04/28/2017    Family History  Problem Relation Age of Onset   Hypertension Mother    Hypertension Father    Cancer Sister 87       lymphoma    Hypertension Sister    Kidney disease Daughter    Stroke Daughter    Hypertension Sister    Hypertension Sister    Hypertension Brother    Hypertension Brother    Gout Brother    Hypertension Brother    Hypertension Brother    Colon cancer Neg Hx    Colon polyps Neg Hx    Esophageal cancer Neg Hx    Rectal cancer Neg Hx    Stomach cancer Neg Hx     Social History   Socioeconomic History   Marital status: Single    Spouse name: Not on file   Number of children: Not on file   Years of  education: Not on file   Highest education level: Not on file  Occupational History   Not on file  Tobacco Use   Smoking status: Every Day    Packs/day: 0.50    Years: 22.00    Total pack years: 11.00    Types: Cigarettes   Smokeless tobacco: Never   Tobacco comments:    5 cigarettes daily  Vaping Use   Vaping Use: Never used  Substance and Sexual Activity   Alcohol use: Yes    Alcohol/week: 0.0 standard drinks of alcohol    Comment: occasional   Drug use: No   Sexual activity: Never  Other Topics Concern   Not on file  Social History Narrative   Works in a [employer]   7 children- one deceased shortly after birth   Lives alone but frequently has family visit   Has 7 grandchildren   Enjoys walking, playing grand kids, park   Dog- outdoor dog.     Social Determinants of Health   Financial Resource Strain: Low Risk  (07/08/2021)   Overall Financial Resource Strain (CARDIA)    Difficulty of Paying Living Expenses: Not hard  at all  Food Insecurity: No Food Insecurity (07/08/2021)   Hunger Vital Sign    Worried About Running Out of Food in the Last Year: Never true    Ran Out of Food in the Last Year: Never true  Transportation Needs: No Transportation Needs (07/08/2021)   PRAPARE - Hydrologist (Medical): No    Lack of Transportation (Non-Medical): No  Physical Activity: Insufficiently Active (07/08/2021)   Exercise Vital Sign    Days of Exercise per Week: 2 days    Minutes of Exercise per Session: 50 min  Stress: No Stress Concern Present (07/08/2021)   [place]    Feeling of Stress : Not at all  Social Connections: Socially Isolated (07/08/2021)   Social Connection and Isolation Panel [NHANES]    Frequency of Communication with Friends and Family: Once a week    Frequency of Social Gatherings with Friends and Family: Once a week    Attends Religious Services:  Never    Marine scientist or Organizations: No    Attends Archivist Meetings: Never    Marital Status: Divorced  Human resources officer Violence: Not At Risk (07/08/2021)   Humiliation, Afraid, Rape, and Kick questionnaire    Fear of Current or Ex-Partner: No    Emotionally Abused: No    Physically Abused: No    Sexually Abused: No    Outpatient Medications Prior to Visit  Medication Sig Dispense Refill   amLODipine (NORVASC) 5 MG tablet TAKE 1 TABLET(5 MG) BY MOUTH DAILY 30 tablet 0   Bioflavonoid Products (ESTER C PO) Take 1 tablet by mouth.     Calcium Carbonate-Vitamin D 600-400 MG-UNIT tablet Take 1 tablet by mouth 2 (two) times daily.     gabapentin (NEURONTIN) 300 MG capsule TAKE 1 CAPSULE(300 MG) BY MOUTH AT BEDTIME 90 capsule 1   hydrochlorothiazide (HYDRODIURIL) 25 MG tablet TAKE 1 TABLET(25 MG) BY MOUTH DAILY 90 tablet 1   loratadine (CLARITIN) 10 MG tablet Take 1 tablet (10 mg total) by mouth daily. 90 tablet 4   losartan (COZAAR) 100 MG tablet TAKE 1 TABLET(100 MG) BY MOUTH DAILY 90 tablet 1   Multiple Vitamin (MULTIVITAMIN) tablet Take 1 tablet by mouth daily. One-a-day womens     potassium chloride SA (KLOR-CON M) 20 MEQ tablet TAKE 1 TABLET(20 MEQ) BY MOUTH DAILY 90 tablet 1   simvastatin (ZOCOR) 40 MG tablet TAKE 1 TABLET(40 MG) BY MOUTH EVERY EVENING 90 tablet 1   COVID-19 mRNA bivalent vaccine, Pfizer, (PFIZER COVID-19 VAC BIVALENT) injection Inject into the muscle. 0.3 mL 0   albuterol (VENTOLIN HFA) 108 (90 Base) MCG/ACT inhaler Inhale 1-2 puffs into the lungs every 6 (six) hours as needed for wheezing or shortness of breath. 8 g 0   benzonatate (TESSALON) 100 MG capsule Take 1 capsule (100 mg total) by mouth every 8 (eight) hours. 21 capsule 0   Facility-Administered Medications Prior to Visit  Medication Dose Route Frequency Provider Last Rate Last Admin   0.9 %  sodium chloride infusion  500 mL Intravenous Once Danis, Estill Cotta III, MD        No Known  Allergies  ROS     Objective:    Physical Exam Constitutional:      Appearance: Normal appearance. She is not ill-appearing.  HENT:     Head: Normocephalic and atraumatic.  Eyes:     Extraocular Movements: Extraocular movements intact.  Pupils: Pupils are equal, round, and reactive to light.  Cardiovascular:     Rate and Rhythm: Normal rate and regular rhythm.     Pulses: Normal pulses.     Heart sounds: Normal heart sounds. No murmur heard.    No gallop.  Pulmonary:     Effort: Pulmonary effort is normal. No respiratory distress.     Breath sounds: Normal breath sounds. No wheezing.  Skin:    General: Skin is warm and dry.  Neurological:     Mental Status: She is alert and oriented to person, place, and time.  Psychiatric:        Judgment: Judgment normal.     BP 127/78 (BP Location: Right Arm, Patient Position: Sitting, Cuff Size: Small)   Pulse 72   Temp 98.5 F (36.9 C) (Oral)   Resp 16   Wt 182 lb (82.6 kg)   SpO2 99%   BMI 34.39 kg/m  Wt Readings from Last 3 Encounters:  05/01/22 182 lb (82.6 kg)  02/07/22 192 lb (87.1 kg)  08/13/21 182 lb 12.8 oz (82.9 kg)       Assessment & Plan:   Problem List Items Addressed This Visit       Unprioritized   Tobacco abuse    Encouraged complete cessation.       Hyperlipidemia    Lab Results  Component Value Date   CHOL 158 08/13/2021   HDL 74.50 08/13/2021   LDLCALC 72 08/13/2021   TRIG 57.0 08/13/2021   CHOLHDL 2 08/13/2021  At goal on simvastatin '40mg'$  Continue same.        Relevant Orders   Basic metabolic panel   Lipid panel   Essential hypertension    BP Readings from Last 3 Encounters:  05/01/22 127/78  02/07/22 (!) 151/82  08/13/21 117/68  At goal, continue hctz, losartan and amlodipine.       Other Visit Diagnoses     Encounter for screening mammogram for malignant neoplasm of breast    -  Primary   Relevant Orders   MM 3D SCREEN BREAST BILATERAL       No orders of the  defined types were placed in this encounter.   I, Nance Pear, NP, personally preformed the services described in this documentation.  All medical record entries made by the scribe were at my direction and in my presence.  I have reviewed the chart and discharge instructions (if applicable) and agree that the record reflects my personal performance and is accurate and complete. 05/01/2022   I,Mohammed Iqbal,acting as a scribe for Nance Pear, NP.,have documented all relevant documentation on the behalf of Nance Pear, NP,as directed by  Nance Pear, NP while in the presence of Nance Pear, NP.   Nance Pear, NP

## 2022-05-01 NOTE — Patient Instructions (Signed)
Please complete lab work prior to leaving. Please work on quitting smoking completely. Please schedule your mammogram on the first floor.

## 2022-05-01 NOTE — Assessment & Plan Note (Signed)
BP Readings from Last 3 Encounters:  05/01/22 127/78  02/07/22 (!) 151/82  08/13/21 117/68   At goal, continue hctz, losartan and amlodipine.

## 2022-05-01 NOTE — Assessment & Plan Note (Signed)
Lab Results  Component Value Date   CHOL 158 08/13/2021   HDL 74.50 08/13/2021   LDLCALC 72 08/13/2021   TRIG 57.0 08/13/2021   CHOLHDL 2 08/13/2021   At goal on simvastatin '40mg'$  Continue same.

## 2022-05-13 ENCOUNTER — Other Ambulatory Visit: Payer: Self-pay | Admitting: Family

## 2022-05-18 ENCOUNTER — Other Ambulatory Visit: Payer: Self-pay | Admitting: Family

## 2022-06-11 ENCOUNTER — Other Ambulatory Visit: Payer: Self-pay | Admitting: Family

## 2022-06-22 ENCOUNTER — Encounter (HOSPITAL_BASED_OUTPATIENT_CLINIC_OR_DEPARTMENT_OTHER): Payer: Self-pay

## 2022-06-22 ENCOUNTER — Ambulatory Visit (HOSPITAL_BASED_OUTPATIENT_CLINIC_OR_DEPARTMENT_OTHER)
Admission: RE | Admit: 2022-06-22 | Discharge: 2022-06-22 | Disposition: A | Discharge status: Home / Self Care | Payer: Medicare Other | Source: Ambulatory Visit | Attending: Family | Admitting: Family

## 2022-06-22 DIAGNOSIS — Z1231 Encounter for screening mammogram for malignant neoplasm of breast: Secondary | ICD-10-CM | POA: Insufficient documentation

## 2022-07-13 ENCOUNTER — Ambulatory Visit (INDEPENDENT_AMBULATORY_CARE_PROVIDER_SITE_OTHER): Payer: Medicare Other | Admitting: *Deleted

## 2022-07-13 ENCOUNTER — Encounter: Payer: Self-pay | Admitting: Family

## 2022-07-13 VITALS — BP 128/77 | HR 79 | Ht 61.0 in | Wt 185.0 lb

## 2022-07-13 DIAGNOSIS — Z0000 Encounter for general adult medical examination without abnormal findings: Secondary | ICD-10-CM | POA: Diagnosis not present

## 2022-07-13 DIAGNOSIS — Z23 Encounter for immunization: Secondary | ICD-10-CM | POA: Diagnosis not present

## 2022-07-13 NOTE — Patient Instructions (Signed)
Janice Meadows , Thank you for taking time to come for your Medicare Wellness Visit. I appreciate your ongoing commitment to your health goals. Please review the following plan we discussed and let me know if I can assist you in the future.   These are the goals we discussed:  Goals      Patient Stated     Drink more water     Quit Smoking        This is a list of the screening recommended for you and due dates:  Health Maintenance  Topic Date Due   Zoster (Shingles) Vaccine (1 of 2) Never done   COVID-19 Vaccine (5 - Pfizer series) 01/25/2022   Flu Shot  05/26/2022   Colon Cancer Screening  04/07/2024   Mammogram  06/22/2024   Tetanus Vaccine  03/03/2025   Pneumonia Vaccine  Completed   DEXA scan (bone density measurement)  Completed   Hepatitis C Screening: USPSTF Recommendation to screen - Ages 75-79 yo.  Completed   HPV Vaccine  Aged Out      Next appointment: Follow up in one year for your annual wellness visit    Preventive Care 65 Years and Older, Female Preventive care refers to lifestyle choices and visits with your health care provider that can promote health and wellness. What does preventive care include? A yearly physical exam. This is also called an annual well check. Dental exams once or twice a year. Routine eye exams. Ask your health care provider how often you should have your eyes checked. Personal lifestyle choices, including: Daily care of your teeth and gums. Regular physical activity. Eating a healthy diet. Avoiding tobacco and drug use. Limiting alcohol use. Practicing safe sex. Taking low-dose aspirin every day. Taking vitamin and mineral supplements as recommended by your health care provider. What happens during an annual well check? The services and screenings done by your health care provider during your annual well check will depend on your age, overall health, lifestyle risk factors, and family history of disease. Counseling  Your health  care provider may ask you questions about your: Alcohol use. Tobacco use. Drug use. Emotional well-being. Home and relationship well-being. Sexual activity. Eating habits. History of falls. Memory and ability to understand (cognition). Work and work Statistician. Reproductive health. Screening  You may have the following tests or measurements: Height, weight, and BMI. Blood pressure. Lipid and cholesterol levels. These may be checked every 5 years, or more frequently if you are over 56 years old. Skin check. Lung cancer screening. You may have this screening every year starting at age 60 if you have a 30-pack-year history of smoking and currently smoke or have quit within the past 15 years. Fecal occult blood test (FOBT) of the stool. You may have this test every year starting at age 51. Flexible sigmoidoscopy or colonoscopy. You may have a sigmoidoscopy every 5 years or a colonoscopy every 10 years starting at age 63. Hepatitis C blood test. Hepatitis B blood test. Sexually transmitted disease (STD) testing. Diabetes screening. This is done by checking your blood sugar (glucose) after you have not eaten for a while (fasting). You may have this done every 1-3 years. Bone density scan. This is done to screen for osteoporosis. You may have this done starting at age 29. Mammogram. This may be done every 1-2 years. Talk to your health care provider about how often you should have regular mammograms. Talk with your health care provider about your test results, treatment options,  and if necessary, the need for more tests. Vaccines  Your health care provider may recommend certain vaccines, such as: Influenza vaccine. This is recommended every year. Tetanus, diphtheria, and acellular pertussis (Tdap, Td) vaccine. You may need a Td booster every 10 years. Zoster vaccine. You may need this after age 40. Pneumococcal 13-valent conjugate (PCV13) vaccine. One dose is recommended after age  32. Pneumococcal polysaccharide (PPSV23) vaccine. One dose is recommended after age 19. Talk to your health care provider about which screenings and vaccines you need and how often you need them. This information is not intended to replace advice given to you by your health care provider. Make sure you discuss any questions you have with your health care provider. Document Released: 11/08/2015 Document Revised: 07/01/2016 Document Reviewed: 08/13/2015 Elsevier Interactive Patient Education  2017 [place] Prevention in the Home Falls can cause injuries. They can happen to people of all ages. There are many things you can do to make your home safe and to help prevent falls. What can I do on the outside of my home? Regularly fix the edges of walkways and driveways and fix any cracks. Remove anything that might make you trip as you walk through a door, such as a raised step or threshold. Trim any bushes or trees on the path to your home. Use bright outdoor lighting. Clear any walking paths of anything that might make someone trip, such as rocks or tools. Regularly check to see if handrails are loose or broken. Make sure that both sides of any steps have handrails. Any raised decks and porches should have guardrails on the edges. Have any leaves, snow, or ice cleared regularly. Use sand or salt on walking paths during winter. Clean up any spills in your garage right away. This includes oil or grease spills. What can I do in the bathroom? Use night lights. Install grab bars by the toilet and in the tub and shower. Do not use towel bars as grab bars. Use non-skid mats or decals in the tub or shower. If you need to sit down in the shower, use a plastic, non-slip stool. Keep the floor dry. Clean up any water that spills on the floor as soon as it happens. Remove soap buildup in the tub or shower regularly. Attach bath mats securely with double-sided non-slip rug tape. Do not have throw  rugs and other things on the floor that can make you trip. What can I do in the bedroom? Use night lights. Make sure that you have a light by your bed that is easy to reach. Do not use any sheets or blankets that are too big for your bed. They should not hang down onto the floor. Have a firm chair that has side arms. You can use this for support while you get dressed. Do not have throw rugs and other things on the floor that can make you trip. What can I do in the kitchen? Clean up any spills right away. Avoid walking on wet floors. Keep items that you use a lot in easy-to-reach places. If you need to reach something above you, use a strong step stool that has a grab bar. Keep electrical cords out of the way. Do not use floor polish or wax that makes floors slippery. If you must use wax, use non-skid floor wax. Do not have throw rugs and other things on the floor that can make you trip. What can I do with my stairs? Do not leave  any items on the stairs. Make sure that there are handrails on both sides of the stairs and use them. Fix handrails that are broken or loose. Make sure that handrails are as long as the stairways. Check any carpeting to make sure that it is firmly attached to the stairs. Fix any carpet that is loose or worn. Avoid having throw rugs at the top or bottom of the stairs. If you do have throw rugs, attach them to the floor with carpet tape. Make sure that you have a light switch at the top of the stairs and the bottom of the stairs. If you do not have them, ask someone to add them for you. What else can I do to help prevent falls? Wear shoes that: Do not have high heels. Have rubber bottoms. Are comfortable and fit you well. Are closed at the toe. Do not wear sandals. If you use a stepladder: Make sure that it is fully opened. Do not climb a closed stepladder. Make sure that both sides of the stepladder are locked into place. Ask someone to hold it for you, if  possible. Clearly mark and make sure that you can see: Any grab bars or handrails. First and last steps. Where the edge of each step is. Use tools that help you move around (mobility aids) if they are needed. These include: Canes. Walkers. Scooters. Crutches. Turn on the lights when you go into a dark area. Replace any light bulbs as soon as they burn out. Set up your furniture so you have a clear path. Avoid moving your furniture around. If any of your floors are uneven, fix them. If there are any pets around you, be aware of where they are. Review your medicines with your doctor. Some medicines can make you feel dizzy. This can increase your chance of falling. Ask your doctor what other things that you can do to help prevent falls. This information is not intended to replace advice given to you by your health care provider. Make sure you discuss any questions you have with your health care provider. Document Released: 08/08/2009 Document Revised: 03/19/2016 Document Reviewed: 11/16/2014 Elsevier Interactive Patient Education  2017 [employer].

## 2022-07-13 NOTE — Progress Notes (Signed)
Subjective:   Janice Meadows is a 71 y.o. female who presents for Medicare Annual (Subsequent) preventive examination.  Review of Systems    Defer to PCP Cardiac Risk Factors include: advanced age (>87mn, >>49women);hypertension;dyslipidemia     Objective:    Today's Vitals   07/13/22 0820  BP: 128/77  Pulse: 79  Weight: 185 lb (83.9 kg)  Height: '5\' 1"'$  (1.549 m)   Body mass index is 34.96 kg/m.     07/13/2022    8:25 AM 02/07/2022   11:08 AM 07/08/2021    1:11 PM 02/28/2019    8:15 AM 08/06/2017   10:08 AM 07/16/2017    1:24 PM 05/12/2017   12:18 PM  Advanced Directives  Does Patient Have a Medical Advance Directive? No No No No No No No  Would patient like information on creating a medical advance directive? No - Patient declined No - Patient declined Yes (MAU/Ambulatory/Procedural Areas - Information given) Yes (MAU/Ambulatory/Procedural Areas - Information given)   Yes (MAU/Ambulatory/Procedural Areas - Information given)    Current Medications (verified) Outpatient Encounter Medications as of 07/13/2022  Medication Sig   amLODipine (NORVASC) 5 MG tablet TAKE 1 TABLET(5 MG) BY MOUTH DAILY   Calcium Carbonate-Vitamin D 600-400 MG-UNIT tablet Take 1 tablet by mouth 2 (two) times daily.   gabapentin (NEURONTIN) 300 MG capsule TAKE 1 CAPSULE(300 MG) BY MOUTH AT BEDTIME   hydrochlorothiazide (HYDRODIURIL) 25 MG tablet TAKE 1 TABLET(25 MG) BY MOUTH DAILY   loratadine (CLARITIN) 10 MG tablet Take 1 tablet (10 mg total) by mouth daily.   losartan (COZAAR) 100 MG tablet TAKE 1 TABLET(100 MG) BY MOUTH DAILY   Multiple Vitamin (MULTIVITAMIN) tablet Take 1 tablet by mouth daily. One-a-day womens   potassium chloride SA (KLOR-CON M) 20 MEQ tablet TAKE 1 TABLET(20 MEQ) BY MOUTH DAILY   simvastatin (ZOCOR) 40 MG tablet TAKE 1 TABLET(40 MG) BY MOUTH EVERY EVENING   [DISCONTINUED] Bioflavonoid Products (ESTER C PO) Take 1 tablet by mouth.   Facility-Administered Encounter Medications as  of 07/13/2022  Medication   0.9 %  sodium chloride infusion    Allergies (verified) Patient has no known allergies.   History: Past Medical History:  Diagnosis Date   Arthritis    back and knees   Cataract    High cholesterol    Hypertension    Neuromuscular disorder (HCC)    neuropathy   Neuropathy    Past Surgical History:  Procedure Laterality Date   ABDOMINAL HYSTERECTOMY     BREAST CYST EXCISION Bilateral    TOOTH EXTRACTION  04/28/2017   Family History  Problem Relation Age of Onset   Hypertension Mother    Hypertension Father    Cancer Sister [id number]      lymphoma    Hypertension Sister    Kidney disease Daughter    Stroke Daughter    Hypertension Sister    Hypertension Sister    Hypertension Brother    Hypertension Brother    Gout Brother    Hypertension Brother    Hypertension Brother    Colon cancer Neg Hx    Colon polyps Neg Hx    Esophageal cancer Neg Hx    Rectal cancer Neg Hx    Stomach cancer Neg Hx    Social History   Socioeconomic History   Marital status: Single    Spouse name: Not on file   Number of children: Not on file   Years of education: Not on file  Highest education level: Not on file  Occupational History   Not on file  Tobacco Use   Smoking status: Every Day    Packs/day: 0.50    Years: 22.00    Total pack years: 11.00    Types: Cigarettes   Smokeless tobacco: Never   Tobacco comments:    5 cigarettes daily  Vaping Use   Vaping Use: Never used  Substance and Sexual Activity   Alcohol use: Yes    Alcohol/week: 0.0 standard drinks of alcohol    Comment: occasional   Drug use: No   Sexual activity: Never  Other Topics Concern   Not on file  Social History Narrative   Works in a [place]   7 children- one deceased shortly after birth   Lives alone but frequently has family visit   Has 7 grandchildren   Enjoys walking, playing grand kids, park   Dog- outdoor dog.     Social Determinants of Health    Financial Resource Strain: Low Risk  (07/08/2021)   Overall Financial Resource Strain (CARDIA)    Difficulty of Paying Living Expenses: Not hard at all  Food Insecurity: No Food Insecurity (07/08/2021)   Hunger Vital Sign    Worried About Running Out of Food in the Last Year: Never true    Ran Out of Food in the Last Year: Never true  Transportation Needs: No Transportation Needs (07/08/2021)   PRAPARE - Hydrologist (Medical): No    Lack of Transportation (Non-Medical): No  Physical Activity: Insufficiently Active (07/08/2021)   Exercise Vital Sign    Days of Exercise per Week: 2 days    Minutes of Exercise per Session: 50 min  Stress: No Stress Concern Present (07/08/2021)   [place]    Feeling of Stress : Not at all  Social Connections: Socially Isolated (07/08/2021)   Social Connection and Isolation Panel [NHANES]    Frequency of Communication with Friends and Family: Once a week    Frequency of Social Gatherings with Friends and Family: Once a week    Attends Religious Services: Never    Marine scientist or Organizations: No    Attends Music therapist: Never    Marital Status: Divorced    Tobacco Counseling Ready to quit: Not Answered Counseling given: Not Answered Tobacco comments: 5 cigarettes daily   Clinical Intake:  Pre-visit preparation completed: Yes  Pain : No/denies pain     Diabetes: No  How often do you need to have someone help you when you read instructions, pamphlets, or other written materials from your doctor or pharmacy?: 1 - Never  Diabetic? No   Activities of Daily Living    07/13/2022    8:25 AM  In your present state of health, do you have any difficulty performing the following activities:  Hearing? 0  Vision? 0  Difficulty concentrating or making decisions? 0  Walking or climbing stairs? 1  Comment knee arthritis   Dressing or bathing? 0  Doing errands, shopping? 0  Preparing Food and eating ? N  Using the Toilet? N  In the past six months, have you accidently leaked urine? N  Do you have problems with loss of bowel control? N  Managing your Medications? N  Managing your Finances? N  Housekeeping or managing your Housekeeping? N    Patient Care Team: Debbrah Alar, NP as PCP - General (Internal  Medicine) Nahser, Wonda Cheng, MD as PCP - Cardiology (Cardiology)  Indicate any recent Medical Services you may have received from other than [facility] providers in the past year (date may be approximate).     Assessment:   This is a routine wellness examination for Tanesha.  Hearing/Vision screen No results found.  Dietary issues and exercise activities discussed: Current Exercise Habits: Home exercise routine, Type of exercise: stretching, Time (Minutes): 10, Frequency (Times/Week): 3, Weekly Exercise (Minutes/Week): 30, Intensity: Mild, Exercise limited by: None identified   Goals Addressed             This Visit's Progress    Quit Smoking   Not on track      Depression Screen    07/13/2022    8:25 AM 07/08/2021    1:13 PM 04/16/2020    9:19 AM 02/28/2019    8:16 AM 06/22/2018    8:39 AM 05/12/2017   11:32 AM 09/07/2016    8:41 AM  PHQ 2/9 Scores  PHQ - 2 Score 0 0 0 0 0 0 0  PHQ- 9 Score     0      Fall Risk    07/13/2022    8:25 AM 07/08/2021    1:12 PM 04/16/2020    9:16 AM 02/28/2019    8:16 AM 06/22/2018    7:29 AM  [place] in the past year? 0 0 0 0 No  Number falls in past yr: 0 0 0    Injury with Fall? 0 0     Risk for fall due to : No Fall Risks      Follow up Falls evaluation completed Falls prevention discussed       FALL RISK PREVENTION PERTAINING TO THE HOME:  Any stairs in or around the home? Yes  If so, are there any without handrails? No  Home free of loose throw rugs in walkways, pet beds, electrical cords, etc? Yes  Adequate lighting in your home to  reduce risk of falls? Yes   ASSISTIVE DEVICES UTILIZED TO PREVENT FALLS:  Life alert? No  Use of a cane, walker or w/c? No  Grab bars in the bathroom? Yes  Shower chair or bench in shower? No  Elevated toilet seat or a handicapped toilet? Yes   TIMED UP AND GO:  Was the test performed? Yes .  Length of time to ambulate 10 feet: 7 sec.   Gait steady and fast without use of assistive device  Cognitive Function:        07/13/2022    8:31 AM  6CIT Screen  What Year? 0 points  What month? 0 points  What time? 0 points  Count back from 20 0 points  Months in reverse 0 points  Repeat phrase 4 points  Total Score 4 points    Immunizations Immunization History  Administered Date(s) Administered   Fluad Quad(high Dose 65+) 07/25/2019, 07/17/2020, 08/13/2021, 07/13/2022   Influenza, High Dose Seasonal PF 09/14/2017, 08/12/2018   Influenza,inj,Quad PF,6+ Mos 09/09/2015, 07/31/2016   PFIZER Comirnaty(Gray Top)Covid-19 Tri-Sucrose Vaccine 02/13/2021   PFIZER(Purple Top)SARS-COV-2 Vaccination 02/10/2020, 03/05/2020   Pfizer Covid-19 Vaccine Bivalent Booster 45yr & up 09/26/2021   Pneumococcal Conjugate-13 02/10/2017   Pneumococcal Polysaccharide-23 10/26/2012, 06/22/2018   Tdap 03/04/2015    TDAP status: Up to date  Flu Vaccine status: Completed at today's visit  Pneumococcal vaccine status: Up to date  Covid-19 vaccine status: Information provided on how to obtain vaccines.   Qualifies  for Shingles Vaccine? Yes   Zostavax completed No   Shingrix Completed?: No.    Education has been provided regarding the importance of this vaccine. Patient has been advised to call insurance company to determine out of pocket expense if they have not yet received this vaccine. Advised may also receive vaccine at local pharmacy or Health Dept. Verbalized acceptance and understanding.  Screening Tests Health Maintenance  Topic Date Due   Zoster Vaccines- Shingrix (1 of 2) Never done    COVID-19 Vaccine (5 - Pfizer series) 01/25/2022   COLONOSCOPY (Pts 45-2yr Insurance coverage will need to be confirmed)  04/07/2024   MAMMOGRAM  06/22/2024   TETANUS/TDAP  03/03/2025   Pneumonia Vaccine 70 Years old  Completed   INFLUENZA VACCINE  Completed   DEXA SCAN  Completed   Hepatitis C Screening  Completed   HPV VACCINES  Aged Out    Health Maintenance  Health Maintenance Due  Topic Date Due   Zoster Vaccines- Shingrix (1 of 2) Never done   COVID-19 Vaccine (5 - Pfizer series) 01/25/2022    Colorectal cancer screening: Type of screening: Colonoscopy. Completed 04/07/21. Repeat every 3 years  Mammogram status: Completed 06/22/22. Repeat every year  Bone Density status: Completed 07/28/21. Results reflect: Bone density results: OSTEOPENIA. Repeat every 2 years.  Lung Cancer Screening: (Low Dose CT Chest recommended if Age 70-80years, 30 pack-year currently smoking OR have quit w/in 15years.) does qualify.   Lung Cancer Screening Referral: N/a  Additional Screening:  Hepatitis C Screening: does qualify; Completed 09/09/15  Vision Screening: Recommended annual ophthalmology exams for early detection of glaucoma and other disorders of the eye. Is the patient up to date with their annual eye exam?  Yes  Who is the provider or what is the name of the office in which the patient attends annual eye exams? Can't remember doctor's name If pt is not established with a provider, would they like to be referred to a provider to establish care? No .   Dental Screening: Recommended annual dental exams for proper oral hygiene  Community Resource Referral / Chronic Care Management: CRR required this visit?  No   CCM required this visit?  No      Plan:     I have personally reviewed and noted the following in the patient's chart:   Medical and social history Use of alcohol, tobacco or illicit drugs  Current medications and supplements including opioid prescriptions. Patient  is not currently taking opioid prescriptions. Functional ability and status Nutritional status Physical activity Advanced directives List of other physicians Hospitalizations, surgeries, and ER visits in previous 12 months Vitals Screenings to include cognitive, depression, and falls Referrals and appointments  In addition, I have reviewed and discussed with patient certain preventive protocols, quality metrics, and best practice recommendations. A written personalized care plan for preventive services as well as general preventive health recommendations were provided to patient.     BBeatris Ship [place]  07/13/2022   Nurse Notes: None

## 2022-07-25 ENCOUNTER — Other Ambulatory Visit: Payer: Self-pay | Admitting: Family

## 2022-10-07 ENCOUNTER — Ambulatory Visit (INDEPENDENT_AMBULATORY_CARE_PROVIDER_SITE_OTHER): Payer: Medicare Other

## 2022-10-07 ENCOUNTER — Encounter: Payer: Self-pay | Admitting: Emergency Medicine

## 2022-10-07 ENCOUNTER — Ambulatory Visit
Admission: EM | Admit: 2022-10-07 | Discharge: 2022-10-07 | Disposition: A | Discharge status: Home / Self Care | Payer: Medicare Other | Attending: Family Medicine | Admitting: Family Medicine

## 2022-10-07 DIAGNOSIS — M7551 Bursitis of right shoulder: Secondary | ICD-10-CM | POA: Diagnosis not present

## 2022-10-07 DIAGNOSIS — M25511 Pain in right shoulder: Secondary | ICD-10-CM

## 2022-10-07 MED ORDER — CELECOXIB 200 MG PO CAPS: 200.0000 mg | ORAL_CAPSULE | Freq: Every day | ORAL | 0 refills | Status: DC

## 2022-10-07 NOTE — ED Triage Notes (Signed)
Patient c/o right shoulder pain x 1 week.  Denies any injury.  Patient does take Tylenol for the pain.

## 2022-10-07 NOTE — Discharge Instructions (Signed)
Take Celebrex once a day This is an arthritis pain medication that will reduce inflammation from bursitis See your doctor if you need refills

## 2022-10-07 NOTE — ED Provider Notes (Signed)
[facility]    CSN: [id number] Arrival date & time: 10/07/22  0854      History   Chief Complaint Chief Complaint  Patient presents with   Shoulder Pain    HPI Janice Meadows is a 71 y.o. female.   HPI  Janice Meadows is a 71 year old lady who still works full-time.  She states that she picks up socks with her right arm fully extended and out to the side, brings it to her lap to tag, then reaches for another sock 100s of times a day.  She is here for right shoulder pain.  She has had no accident or injury.  Thinks it may be from repetitive use.  States she has arthritis "all over"  Past Medical History:  Diagnosis Date   Arthritis    back and knees   Cataract    High cholesterol    Hypertension    Neuromuscular disorder ([place])    neuropathy   Neuropathy     Patient Active Problem List   Diagnosis Date Noted   Other chest pain 08/28/2016   Abnormal EKG 08/28/2016   Low back pain 09/23/2015   Tobacco abuse 09/09/2015   Preventative health care 03/04/2015   Essential hypertension 02/04/2015   Hyperlipidemia 02/04/2015   Neuropathy 02/04/2015   Generalized OA 04/01/2013    Past Surgical History:  Procedure Laterality Date   ABDOMINAL HYSTERECTOMY     BREAST CYST EXCISION Bilateral    TOOTH EXTRACTION  04/28/2017    OB History   No obstetric history on file.      Home Medications    Prior to Admission medications   Medication Sig Start Date End Date Taking? Authorizing Provider  amLODipine (NORVASC) 5 MG tablet Take 1 tablet (5 mg total) by mouth daily. 07/27/22  Yes Debbrah Alar, NP  Calcium Carbonate-Vitamin D 600-400 MG-UNIT tablet Take 1 tablet by mouth 2 (two) times daily. 06/22/18  Yes Debbrah Alar, NP  celecoxib (CELEBREX) 200 MG capsule Take 1 capsule (200 mg total) by mouth daily. 10/07/22  Yes Raylene Everts, MD  gabapentin (NEURONTIN) 300 MG capsule TAKE 1 CAPSULE(300 MG) BY MOUTH AT BEDTIME 07/27/22  Yes Debbrah Alar, NP   hydrochlorothiazide (HYDRODIURIL) 25 MG tablet TAKE 1 TABLET(25 MG) BY MOUTH DAILY 04/01/22  Yes Debbrah Alar, NP  loratadine (CLARITIN) 10 MG tablet Take 1 tablet (10 mg total) by mouth daily. 07/24/20  Yes Debbrah Alar, NP  losartan (COZAAR) 100 MG tablet TAKE 1 TABLET(100 MG) BY MOUTH DAILY 05/18/22  Yes Debbrah Alar, NP  Multiple Vitamin (MULTIVITAMIN) tablet Take 1 tablet by mouth daily. One-a-day womens   Yes [provider]  potassium chloride SA (KLOR-CON M) 20 MEQ tablet TAKE 1 TABLET(20 MEQ) BY MOUTH DAILY 11/13/21  Yes Debbrah Alar, NP  simvastatin (ZOCOR) 40 MG tablet TAKE 1 TABLET(40 MG) BY MOUTH EVERY EVENING 05/13/22  Yes Debbrah Alar, NP    Family History Family History  Problem Relation Age of Onset   Hypertension Mother    Hypertension Father    Cancer Sister 58       lymphoma    Hypertension Sister    Kidney disease Daughter    Stroke Daughter    Hypertension Sister    Hypertension Sister    Hypertension Brother    Hypertension Brother    Gout Brother    Hypertension Brother    Hypertension Brother    Colon cancer Neg Hx    Colon polyps Neg Hx  Esophageal cancer Neg Hx    Rectal cancer Neg Hx    Stomach cancer Neg Hx     Social History Social History   Tobacco Use   Smoking status: Every Day    Packs/day: 0.50    Years: 22.00    Total pack years: 11.00    Types: Cigarettes   Smokeless tobacco: Never   Tobacco comments:    5 cigarettes daily  Vaping Use   Vaping Use: Never used  Substance Use Topics   Alcohol use: Yes    Alcohol/week: 0.0 standard drinks of alcohol    Comment: occasional   Drug use: No     Allergies   Patient has no known allergies.   Review of Systems Review of Systems See HPI  Physical Exam Triage Vital Signs ED Triage Vitals  Enc Vitals Group     BP 10/07/22 0918 (!) 149/93     Pulse Rate 10/07/22 0918 69     Resp 10/07/22 0918 18     Temp 10/07/22 0918 98.1 F (36.7  C)     Temp Source 10/07/22 0918 Oral     SpO2 10/07/22 0918 96 %     Weight 10/07/22 0920 185 lb (83.9 kg)     Height 10/07/22 0920 '5\' 2"'$  (1.575 m)     Head Circumference --      Peak Flow --      Pain Score 10/07/22 0920 0     Pain Loc --      Pain Edu? --      Excl. in [place]? --    No data found.  Updated Vital Signs BP (!) 149/93 (BP Location: Left Arm)   Pulse 69   Temp 98.1 F (36.7 C) (Oral)   Resp 18   Ht '5\' 2"'$  (1.575 m)   Wt 83.9 kg   SpO2 96%   BMI 33.84 kg/m       Physical Exam Constitutional:      General: She is not in acute distress.    Appearance: She is well-developed.  HENT:     Head: Normocephalic and atraumatic.  Eyes:     Conjunctiva/sclera: Conjunctivae normal.     Pupils: Pupils are equal, round, and reactive to light.  Cardiovascular:     Rate and Rhythm: Normal rate.  Pulmonary:     Effort: Pulmonary effort is normal. No respiratory distress.  Abdominal:     General: There is no distension.     Palpations: Abdomen is soft.  Musculoskeletal:        General: Tenderness present. Normal range of motion.     Cervical back: Normal range of motion.     Comments: Point tenderness under the acromial process of the shoulder, laterally.  Good range of motion.  Some pain with internal rotation.  Skin:    General: Skin is warm and dry.  Neurological:     Mental Status: She is alert.      UC Treatments / Results  Labs (all labs ordered are listed, but only abnormal results are displayed) Labs Reviewed - No data to display  EKG   Radiology DG Shoulder Right  Result Date: 10/07/2022 CLINICAL DATA:  Pain without trauma EXAM: RIGHT SHOULDER - 2+ VIEW COMPARISON:  04/16/2020 FINDINGS: There is no evidence of fracture or dislocation. There is no evidence of arthropathy or other focal bone abnormality. Soft tissues are unremarkable. IMPRESSION: Negative. Electronically Signed   By: Lucrezia Europe M.D.   On: 10/07/2022 09:57  Procedures Procedures  (including critical care time)  Medications Ordered in UC Medications - No data to display  Initial Impression / Assessment and Plan / UC Course  I have reviewed the triage vital signs and the nursing notes.  Pertinent labs & imaging results that were available during my care of the patient were reviewed by me and considered in my medical decision making (see chart for details).     Final Clinical Impressions(s) / UC Diagnoses   Final diagnoses:  Acute pain of right shoulder  Bursitis of right shoulder     Discharge Instructions      Take Celebrex once a day This is an arthritis pain medication that will reduce inflammation from bursitis See your doctor if you need refills   ED Prescriptions     Medication Sig Dispense Auth. Provider   celecoxib (CELEBREX) 200 MG capsule Take 1 capsule (200 mg total) by mouth daily. 30 capsule Raylene Everts, MD      PDMP not reviewed this encounter.   Raylene Everts, MD 10/07/22 1009

## 2022-10-12 DIAGNOSIS — M1711 Unilateral primary osteoarthritis, right knee: Secondary | ICD-10-CM | POA: Diagnosis not present

## 2022-10-12 DIAGNOSIS — G8929 Other chronic pain: Secondary | ICD-10-CM | POA: Diagnosis not present

## 2022-10-22 ENCOUNTER — Other Ambulatory Visit: Payer: Self-pay

## 2022-10-22 MED ORDER — POTASSIUM CHLORIDE CRYS ER 20 MEQ PO TBCR: EXTENDED_RELEASE_TABLET | ORAL | 1 refills | Status: DC

## 2022-11-06 ENCOUNTER — Encounter: Payer: Self-pay | Admitting: Family

## 2022-11-06 ENCOUNTER — Ambulatory Visit (INDEPENDENT_AMBULATORY_CARE_PROVIDER_SITE_OTHER): Payer: Medicare Other | Admitting: Family

## 2022-11-06 VITALS — BP 110/60 | HR 85 | Temp 98.2°F | Resp 18 | Ht 62.0 in | Wt 191.0 lb

## 2022-11-06 DIAGNOSIS — I10 Essential (primary) hypertension: Secondary | ICD-10-CM | POA: Diagnosis not present

## 2022-11-06 DIAGNOSIS — E785 Hyperlipidemia, unspecified: Secondary | ICD-10-CM | POA: Diagnosis not present

## 2022-11-06 DIAGNOSIS — M25511 Pain in right shoulder: Secondary | ICD-10-CM

## 2022-11-06 DIAGNOSIS — G629 Polyneuropathy, unspecified: Secondary | ICD-10-CM

## 2022-11-06 DIAGNOSIS — M7581 Other shoulder lesions, right shoulder: Secondary | ICD-10-CM | POA: Insufficient documentation

## 2022-11-06 LAB — LIPID PANEL
Cholesterol: 173 mg/dL (ref 0–200)
HDL: 69.8 mg/dL (ref >39.00)
LDL Cholesterol: 85 mg/dL (ref 0–99)
NonHDL: 103.28
Total CHOL/HDL Ratio: 2
Triglycerides: 93 mg/dL (ref 0.0–149.0)
VLDL: 18.6 mg/dL (ref 0.0–40.0)

## 2022-11-06 LAB — COMPREHENSIVE METABOLIC PANEL
ALT: 10 U/L (ref 0–35)
AST: 21 U/L (ref 0–37)
Albumin: 3.6 g/dL (ref 3.5–5.2)
Alkaline Phosphatase: 83 U/L (ref 39–117)
BUN: 14 mg/dL (ref 6–23)
CO2: 27 mEq/L (ref 19–32)
Calcium: 8.8 mg/dL (ref 8.4–10.5)
Chloride: 106 mEq/L (ref 96–112)
Creatinine, Ser: 0.89 mg/dL (ref 0.40–1.20)
GFR: 65.41 mL/min (ref >60.00)
Glucose, Bld: 89 mg/dL (ref 70–99)
Potassium: 3.8 mEq/L (ref 3.5–5.1)
Sodium: 142 mEq/L (ref 135–145)
Total Bilirubin: 0.3 mg/dL (ref 0.2–1.2)
Total Protein: 6.6 g/dL (ref 6.0–8.3)

## 2022-11-06 MED ORDER — LOSARTAN POTASSIUM 100 MG PO TABS: 100.0000 mg | ORAL_TABLET | Freq: Every day | ORAL | 1 refills | Status: DC

## 2022-11-06 MED ORDER — GABAPENTIN 300 MG PO CAPS: ORAL_CAPSULE | ORAL | 1 refills | Status: DC

## 2022-11-06 MED ORDER — ATORVASTATIN CALCIUM 20 MG PO TABS: 20.0000 mg | ORAL_TABLET | Freq: Every day | ORAL | 3 refills | Status: DC

## 2022-11-06 MED ORDER — HYDROCHLOROTHIAZIDE 25 MG PO TABS: ORAL_TABLET | ORAL | 1 refills | Status: DC

## 2022-11-06 NOTE — Progress Notes (Signed)
Subjective:     Patient ID: Janice Meadows, female    DOB: Aug 11, 1951, 72 y.o.   MRN: [id number]  Chief Complaint  Patient presents with   Follow-up    HPI  Right shoulder pain- anterior. Started around 12/7.  Went to urgent care and was given celebrex which is not helping. Pain is getting worse.  HTN- maintained on losartan hctz, amlodipine and losartan.  BP Readings from Last 3 Encounters:  11/06/22 110/60  10/07/22 (!) 149/93  07/13/22 128/77   Neuropathy- stable on gabapentin.   Hyperlipidemia- maintained on simvastatin.  Lab Results  Component Value Date   CHOL 179 05/01/2022   HDL 76.90 05/01/2022   LDLCALC 90 05/01/2022   TRIG 62.0 05/01/2022   CHOLHDL 2 05/01/2022    Health Maintenance Due  Topic Date Due   Zoster Vaccines- Shingrix (1 of 2) Never done    Past Medical History:  Diagnosis Date   Arthritis    back and knees   Cataract    High cholesterol    Hypertension    Neuromuscular disorder ([place])    neuropathy   Neuropathy     Past Surgical History:  Procedure Laterality Date   ABDOMINAL HYSTERECTOMY     BREAST CYST EXCISION Bilateral    TOOTH EXTRACTION  04/28/2017    Family History  Problem Relation Age of Onset   Hypertension Mother    Hypertension Father    Cancer Sister 25       lymphoma    Hypertension Sister    Kidney disease Daughter    Stroke Daughter    Hypertension Sister    Hypertension Sister    Hypertension Brother    Hypertension Brother    Gout Brother    Hypertension Brother    Hypertension Brother    Colon cancer Neg Hx    Colon polyps Neg Hx    Esophageal cancer Neg Hx    Rectal cancer Neg Hx    Stomach cancer Neg Hx     Social History   Socioeconomic History   Marital status: Single    Spouse name: Not on file   Number of children: Not on file   Years of education: Not on file   Highest education level: Not on file  Occupational History   Not on file  Tobacco Use   Smoking status: Every Day     Packs/day: 0.50    Years: 22.00    Total pack years: 11.00    Types: Cigarettes   Smokeless tobacco: Never   Tobacco comments:    5 cigarettes daily  Vaping Use   Vaping Use: Never used  Substance and Sexual Activity   Alcohol use: Yes    Alcohol/week: 0.0 standard drinks of alcohol    Comment: occasional   Drug use: No   Sexual activity: Never  Other Topics Concern   Not on file  Social History Narrative   Works in a [place]   7 children- one deceased shortly after birth   Lives alone but frequently has family visit   Has 7 grandchildren   Enjoys walking, playing grand kids, park   Dog- outdoor dog.     Social Determinants of Health   Financial Resource Strain: Low Risk  (07/08/2021)   Overall Financial Resource Strain (CARDIA)    Difficulty of Paying Living Expenses: Not hard at all  Food Insecurity: No Food Insecurity (07/08/2021)   Hunger Vital Sign    Worried About  Running Out of Food in the Last Year: Never true    [place] in the Last Year: Never true  Transportation Needs: No Transportation Needs (07/08/2021)   PRAPARE - Hydrologist (Medical): No    Lack of Transportation (Non-Medical): No  Physical Activity: Insufficiently Active (07/08/2021)   Exercise Vital Sign    Days of Exercise per Week: 2 days    Minutes of Exercise per Session: 50 min  Stress: No Stress Concern Present (07/08/2021)   [place]    Feeling of Stress : Not at all  Social Connections: Socially Isolated (07/08/2021)   Social Connection and Isolation Panel [NHANES]    Frequency of Communication with Friends and Family: Once a week    Frequency of Social Gatherings with Friends and Family: Once a week    Attends Religious Services: Never    Marine scientist or Organizations: No    Attends Archivist Meetings: Never    Marital Status: Divorced  Arboriculturist Violence: Not At Risk (07/08/2021)   Humiliation, Afraid, Rape, and Kick questionnaire    Fear of Current or Ex-Partner: No    Emotionally Abused: No    Physically Abused: No    Sexually Abused: No    Outpatient Medications Prior to Visit  Medication Sig Dispense Refill   amLODipine (NORVASC) 5 MG tablet Take 1 tablet (5 mg total) by mouth daily. 90 tablet 1   Calcium Carbonate-Vitamin D 600-400 MG-UNIT tablet Take 1 tablet by mouth 2 (two) times daily.     celecoxib (CELEBREX) 200 MG capsule Take 1 capsule (200 mg total) by mouth daily. 30 capsule 0   loratadine (CLARITIN) 10 MG tablet Take 1 tablet (10 mg total) by mouth daily. 90 tablet 4   Multiple Vitamin (MULTIVITAMIN) tablet Take 1 tablet by mouth daily. One-a-day womens     potassium chloride SA (KLOR-CON M) 20 MEQ tablet TAKE 1 TABLET(20 MEQ) BY MOUTH DAILY 90 tablet 1   gabapentin (NEURONTIN) 300 MG capsule TAKE 1 CAPSULE(300 MG) BY MOUTH AT BEDTIME 90 capsule 1   hydrochlorothiazide (HYDRODIURIL) 25 MG tablet TAKE 1 TABLET(25 MG) BY MOUTH DAILY 90 tablet 1   losartan (COZAAR) 100 MG tablet TAKE 1 TABLET(100 MG) BY MOUTH DAILY 90 tablet 1   simvastatin (ZOCOR) 40 MG tablet TAKE 1 TABLET(40 MG) BY MOUTH EVERY EVENING 90 tablet 1   No facility-administered medications prior to visit.    No Known Allergies  ROS See HPI    Objective:    Physical Exam Constitutional:      General: She is not in acute distress.    Appearance: Normal appearance. She is well-developed.  HENT:     Head: Normocephalic and atraumatic.     Right Ear: External ear normal.     Left Ear: External ear normal.  Eyes:     General: No scleral icterus. Neck:     Thyroid: No thyromegaly.  Cardiovascular:     Rate and Rhythm: Normal rate and regular rhythm.     Heart sounds: Normal heart sounds. No murmur heard. Pulmonary:     Effort: Pulmonary effort is normal. No respiratory distress.     Breath sounds: Normal breath sounds. No wheezing.   Musculoskeletal:     Cervical back: Neck supple.     Comments: Increased shoulder pain with flexion right arm, no tenderness to palpation or swelling.   Skin:  General: Skin is warm and dry.  Neurological:     Mental Status: She is alert and oriented to person, place, and time.  Psychiatric:        Mood and Affect: Mood normal.        Behavior: Behavior normal.        Thought Content: Thought content normal.        Judgment: Judgment normal.     BP 110/60   Pulse 85   Temp 98.2 F (36.8 C)   Resp 18   Ht '5\' 2"'$  (1.575 m)   Wt 191 lb (86.6 kg)   SpO2 98%   BMI 34.93 kg/m  Wt Readings from Last 3 Encounters:  11/06/22 191 lb (86.6 kg)  10/07/22 185 lb (83.9 kg)  07/13/22 185 lb (83.9 kg)       Assessment & Plan:   Problem List Items Addressed This Visit       Unprioritized   Right shoulder pain - Primary    New. No improvement with celebrex. Refer to sports medicine for further evaluation.       Relevant Orders   Ambulatory referral to Sports Medicine   Neuropathy    Stable on gabapentin, continue same.       Hyperlipidemia    Lab Results  Component Value Date   CHOL 179 05/01/2022   HDL 76.90 05/01/2022   LDLCALC 90 05/01/2022   TRIG 62.0 05/01/2022   CHOLHDL 2 05/01/2022  Last lipid panel stable.  Will change simvastatin to atorvastatin due to potential interaction with amlodipine.       Relevant Medications   atorvastatin (LIPITOR) 20 MG tablet   losartan (COZAAR) 100 MG tablet   hydrochlorothiazide (HYDRODIURIL) 25 MG tablet   Other Relevant Orders   Comp Met (CMET)   Lipid panel   Lipid panel   Essential hypertension    Initial bp mildly elevated.  Follow up bp improved.  Continue amlodipine, hctz and losartan.       Relevant Medications   atorvastatin (LIPITOR) 20 MG tablet   losartan (COZAAR) 100 MG tablet   hydrochlorothiazide (HYDRODIURIL) 25 MG tablet    I have discontinued Ellawyn Mcnelly's simvastatin. I have also changed her  losartan. Additionally, I am having her start on atorvastatin. Lastly, I am having her maintain her multivitamin, Calcium Carbonate-Vitamin D, loratadine, amLODipine, celecoxib, potassium chloride SA, hydrochlorothiazide, and gabapentin.  Meds ordered this encounter  Medications   atorvastatin (LIPITOR) 20 MG tablet    Sig: Take 1 tablet (20 mg total) by mouth daily.    Dispense:  90 tablet    Refill:  3    Order Specific Question:   Supervising Provider    Answer:   Penni Homans A [[id number]]   losartan (COZAAR) 100 MG tablet    Sig: Take 1 tablet (100 mg total) by mouth daily.    Dispense:  90 tablet    Refill:  1    Order Specific Question:   Supervising Provider    Answer:   Penni Homans A [[id number]]   hydrochlorothiazide (HYDRODIURIL) 25 MG tablet    Sig: TAKE 1 TABLET(25 MG) BY MOUTH DAILY    Dispense:  90 tablet    Refill:  1    Order Specific Question:   Supervising Provider    Answer:   Penni Homans A [[id number]]   gabapentin (NEURONTIN) 300 MG capsule    Sig: TAKE 1 CAPSULE(300 MG) BY MOUTH AT BEDTIME    Dispense:  90  capsule    Refill:  1    Order Specific Question:   Supervising Provider    Answer:   Penni Homans A [id number]

## 2022-11-06 NOTE — Assessment & Plan Note (Signed)
Stable on gabapentin, continue same.

## 2022-11-06 NOTE — Patient Instructions (Addendum)
Stop simvastatin, instead start atorvastatin.  Complete lab work prior to leaving.

## 2022-11-06 NOTE — Assessment & Plan Note (Signed)
New. No improvement with celebrex. Refer to sports medicine for further evaluation.

## 2022-11-06 NOTE — Assessment & Plan Note (Signed)
Lab Results  Component Value Date   CHOL 179 05/01/2022   HDL 76.90 05/01/2022   LDLCALC 90 05/01/2022   TRIG 62.0 05/01/2022   CHOLHDL 2 05/01/2022   Last lipid panel stable.  Will change simvastatin to atorvastatin due to potential interaction with amlodipine.

## 2022-11-06 NOTE — Progress Notes (Signed)
Letter mailed

## 2022-11-06 NOTE — Assessment & Plan Note (Signed)
Initial bp mildly elevated.  Follow up bp improved.  Continue amlodipine, hctz and losartan.

## 2022-11-09 ENCOUNTER — Ambulatory Visit: Payer: Self-pay

## 2022-11-09 ENCOUNTER — Ambulatory Visit: Payer: Medicare Other | Admitting: Family Medicine

## 2022-11-09 VITALS — BP 138/80 | Ht 62.0 in | Wt 191.0 lb

## 2022-11-09 DIAGNOSIS — M7581 Other shoulder lesions, right shoulder: Secondary | ICD-10-CM

## 2022-11-09 MED ORDER — METHYLPREDNISOLONE ACETATE 40 MG/ML IJ SUSP
40.0000 mg | Freq: Once | INTRAMUSCULAR | Status: AC
  Administered 2022-11-09: 40 mg via INTRAMUSCULAR

## 2022-11-09 NOTE — Progress Notes (Signed)
  Janice Meadows - 72 y.o. female MRN [id number]  Date of birth: 20-Mar-1951  SUBJECTIVE:  Including CC & ROS.  No chief complaint on file.   Janice Meadows is a 72 y.o. female that is presenting with acute right shoulder pain.  She does repetitive actions at work.  No injury inciting event.  No history of surgery.  Independent review of the right shoulder x-ray from 12/13 shows no acute changes.  Review of Systems See HPI   HISTORY: Past Medical, Surgical, Social, and Family History Reviewed & Updated per EMR.   Pertinent Historical Findings include:  Past Medical History:  Diagnosis Date   Arthritis    back and knees   Cataract    High cholesterol    Hypertension    Neuromuscular disorder ([place])    neuropathy   Neuropathy     Past Surgical History:  Procedure Laterality Date   ABDOMINAL HYSTERECTOMY     BREAST CYST EXCISION Bilateral    TOOTH EXTRACTION  04/28/2017     PHYSICAL EXAM:  VS: BP 138/80   Ht '5\' 2"'$  (1.575 m)   Wt 191 lb (86.6 kg)   BMI 34.93 kg/m  Physical Exam Gen: NAD, alert, cooperative with exam, well-appearing MSK:  Neurovascularly intact    Injection/Aspiration Jewelene Mairena 1951/03/17  Procedure: Injection Indications: right shoulder pain  Procedure Details Consent: Risks of procedure as well as the alternatives and risks of each were explained to the (patient/caregiver).  Consent for procedure obtained. Time Out: Verified patient identification, verified procedure, site/side was marked, verified correct patient position, special equipment/implants available, medications/allergies/relevent history reviewed, required imaging and test results available.  Performed.  The area was cleaned with iodine and alcohol swabs.   The right subacromial bursa was injected using 1 cc's of 40 mg Depo-Medrol and 4 cc's of 0.25% bupivacaine was injected.  Ultrasound was used. Images were obtained in long views showing the injection.     A sterile dressing was  applied.  Patient did tolerate procedure well.    ASSESSMENT & PLAN:   Rotator cuff tendinitis, right Acutely occurring. Appears exacerbated with her repetitive actions at work. Has good strength.  - counseled on home exercise therapy and supportive care - injection today  - could consider PT

## 2022-11-09 NOTE — Patient Instructions (Signed)
Nice to meet you Please alternate heat and ice  Please try the exercises   Please send me a message in MyChart with any questions or updates.  Please see me back in 4-6 weeks.   --Dr. Raeford Razor

## 2022-11-09 NOTE — Assessment & Plan Note (Signed)
Acutely occurring. Appears exacerbated with her repetitive actions at work. Has good strength.  - counseled on home exercise therapy and supportive care - injection today  - could consider PT

## 2022-11-25 ENCOUNTER — Other Ambulatory Visit: Payer: Self-pay | Admitting: Family

## 2022-12-07 ENCOUNTER — Ambulatory Visit: Payer: Medicare Other | Admitting: Family Medicine

## 2022-12-07 ENCOUNTER — Encounter: Payer: Self-pay | Admitting: Family Medicine

## 2022-12-07 VITALS — BP 122/72 | Ht 61.0 in | Wt 190.0 lb

## 2022-12-07 DIAGNOSIS — M7581 Other shoulder lesions, right shoulder: Secondary | ICD-10-CM

## 2022-12-07 NOTE — Progress Notes (Signed)
  Janice Meadows - 72 y.o. female MRN [id number]  Date of birth: 02/26/1951  SUBJECTIVE:  Including CC & ROS.  No chief complaint on file.   Janice Meadows is a 72 y.o. female that is following up for her right shoulder pain.  She is doing well since the injection.  Not having any pain.  She feels the strength has not returned yet.    Review of Systems See HPI   HISTORY: Past Medical, Surgical, Social, and Family History Reviewed & Updated per EMR.   Pertinent Historical Findings include:  Past Medical History:  Diagnosis Date   Arthritis    back and knees   Cataract    High cholesterol    Hypertension    Neuromuscular disorder ([place])    neuropathy   Neuropathy     Past Surgical History:  Procedure Laterality Date   ABDOMINAL HYSTERECTOMY     BREAST CYST EXCISION Bilateral    TOOTH EXTRACTION  04/28/2017     PHYSICAL EXAM:  VS: BP 122/72   Ht '5\' 1"'$  (1.549 m)   Wt 190 lb (86.2 kg)   BMI 35.90 kg/m  Physical Exam Gen: NAD, alert, cooperative with exam, well-appearing MSK:  Neurovascularly intact       ASSESSMENT & PLAN:   Rotator cuff tendinitis, right Doing well since the injection.  Has good range of motion and working on her strength. -Counseled on home exercise therapy and supportive care. -Could consider physical therapy.

## 2022-12-07 NOTE — Assessment & Plan Note (Signed)
Doing well since the injection.  Has good range of motion and working on her strength. -Counseled on home exercise therapy and supportive care. -Could consider physical therapy.

## 2022-12-18 ENCOUNTER — Other Ambulatory Visit (INDEPENDENT_AMBULATORY_CARE_PROVIDER_SITE_OTHER): Payer: Medicare Other

## 2022-12-18 ENCOUNTER — Encounter: Payer: Self-pay | Admitting: Family

## 2022-12-18 DIAGNOSIS — E785 Hyperlipidemia, unspecified: Secondary | ICD-10-CM

## 2022-12-18 LAB — LIPID PANEL
Cholesterol: 169 mg/dL (ref 0–200)
HDL: 69.2 mg/dL (ref >39.00)
LDL Cholesterol: 84 mg/dL (ref 0–99)
NonHDL: 100.02
Total CHOL/HDL Ratio: 2
Triglycerides: 78 mg/dL (ref 0.0–149.0)
VLDL: 15.6 mg/dL (ref 0.0–40.0)

## 2022-12-18 NOTE — Progress Notes (Signed)
Letter Mailed

## 2023-02-09 ENCOUNTER — Encounter: Payer: Self-pay | Admitting: *Deleted

## 2023-04-03 ENCOUNTER — Other Ambulatory Visit: Payer: Self-pay | Admitting: Family

## 2023-04-09 ENCOUNTER — Other Ambulatory Visit: Payer: Self-pay

## 2023-04-09 MED ORDER — AMLODIPINE BESYLATE 5 MG PO TABS: 5.0000 mg | ORAL_TABLET | Freq: Every day | ORAL | 1 refills | Status: DC

## 2023-05-07 ENCOUNTER — Ambulatory Visit (INDEPENDENT_AMBULATORY_CARE_PROVIDER_SITE_OTHER): Payer: Medicare Other | Admitting: Family

## 2023-05-07 VITALS — BP 117/72 | HR 85 | Temp 98.0°F | Resp 18 | Ht 61.0 in | Wt 188.0 lb

## 2023-05-07 DIAGNOSIS — I10 Essential (primary) hypertension: Secondary | ICD-10-CM | POA: Diagnosis not present

## 2023-05-07 DIAGNOSIS — E785 Hyperlipidemia, unspecified: Secondary | ICD-10-CM

## 2023-05-07 DIAGNOSIS — G629 Polyneuropathy, unspecified: Secondary | ICD-10-CM

## 2023-05-07 DIAGNOSIS — M159 Polyosteoarthritis, unspecified: Secondary | ICD-10-CM

## 2023-05-07 DIAGNOSIS — Z1231 Encounter for screening mammogram for malignant neoplasm of breast: Secondary | ICD-10-CM

## 2023-05-07 DIAGNOSIS — Z720 Tobacco use: Secondary | ICD-10-CM

## 2023-05-07 LAB — BASIC METABOLIC PANEL
BUN: 12 mg/dL (ref 6–23)
CO2: 27 mEq/L (ref 19–32)
Calcium: 9.3 mg/dL (ref 8.4–10.5)
Chloride: 105 mEq/L (ref 96–112)
Creatinine, Ser: 0.93 mg/dL (ref 0.40–1.20)
GFR: 61.83 mL/min (ref >60.00)
Glucose, Bld: 95 mg/dL (ref 70–99)
Potassium: 3.8 mEq/L (ref 3.5–5.1)
Sodium: 142 mEq/L (ref 135–145)

## 2023-05-07 LAB — VITAMIN B12: Vitamin B-12: 650 pg/mL (ref 211–911)

## 2023-05-07 MED ORDER — HYDROCHLOROTHIAZIDE 25 MG PO TABS
ORAL_TABLET | ORAL | 1 refills | Status: DC
Start: 2023-05-07 — End: 2023-12-29

## 2023-05-07 MED ORDER — VARENICLINE TARTRATE (STARTER) 0.5 MG X 11 & 1 MG X 42 PO TBPK
ORAL_TABLET | ORAL | 0 refills | Status: DC
Start: 2023-05-07 — End: 2023-07-21

## 2023-05-07 MED ORDER — LOSARTAN POTASSIUM 100 MG PO TABS: 100.0000 mg | ORAL_TABLET | Freq: Every day | ORAL | 1 refills | Status: DC

## 2023-05-07 NOTE — Patient Instructions (Signed)
VISIT SUMMARY:  During your recent visit, we discussed your ongoing health conditions including hypertension, arthritis, hyperlipidemia, allergic rhinitis, and peripheral neuropathy. We also discussed your smoking habit and your interest in quitting. Your blood pressure, arthritis symptoms, cholesterol levels, and neuropathy pain are all well managed with your current medications. You are due for a colonoscopy next summer and a mammogram, which we have scheduled for you.  YOUR PLAN:  -HYPERTENSION: Hypertension, or high blood pressure, is well controlled with your current medications (Losartan, Hydrochlorothiazide, and Amlodipine). Continue taking these as prescribed.  -ARTHRITIS: Your arthritis, a condition that causes joint pain and stiffness, is well managed without the use of Celebrex. No changes are needed at this time.  -HYPERLIPIDEMIA: Hyperlipidemia, or high cholesterol, is well controlled with Lipitor. Continue taking this medication as prescribed.  -ALLERGIC RHINITIS: Your allergic rhinitis, or allergies, are managed with Claritin. Continue taking this as needed.  -PERIPHERAL NEUROPATHY: Your peripheral neuropathy, a condition that causes pain in your feet, is well managed with Gabapentin. Continue taking this medication. We will also check your B12 level due to its association with neuropathy.  -SMOKING: You expressed interest in quitting smoking. We will order a lung cancer screening CT scan and prescribe a Chantix starter pack to help you quit. Please contact the office before running out for further refills and complete the full 12-week course.  INSTRUCTIONS:  You are due for a colonoscopy next summer and a mammogram this summer.  Please follow up in 3 months to assess your progress with smoking cessation.

## 2023-05-07 NOTE — Assessment & Plan Note (Addendum)
Lab Results  Component Value Date   CHOL 169 12/18/2022   HDL 69.20 12/18/2022   LDLCALC 84 12/18/2022   TRIG 78.0 12/18/2022   CHOLHDL 2 12/18/2022  Last cholesterol check over winter was within target on Lipitor. -Continue Lipitor at current dose.

## 2023-05-07 NOTE — Assessment & Plan Note (Signed)
Reports no current use of Celebrex and symptoms are well managed. -No changes to current management.

## 2023-05-07 NOTE — Assessment & Plan Note (Signed)
Well controlled on Losartan 100mg , Hydrochlorothiazide 25mg , and Amlodipine 5mg . -Continue current medications.

## 2023-05-07 NOTE — Progress Notes (Signed)
Subjective:     Patient ID: Janice Meadows, female    DOB: May 21, 1951, 72 y.o.   MRN: [id number]  Chief Complaint  Patient presents with   Follow-up    6 month    HPI  Discussed the use of AI scribe software for clinical note transcription with the patient, who gave verbal consent to proceed.  History of Present Illness   The patient, with a history of hypertension, hyperlipidemia, arthritis, and neuropathy, presents for a routine follow-up. She reports no specific concerns. Her blood pressure is well controlled on losartan 100mg , hydrochlorothiazide 25mg , and amlodipine 5mg . She denies any recent arthritis symptoms and is not currently using Celebrex. Her cholesterol was last checked over the winter and was well controlled on Lipitor. She denies any allergy symptoms but continues to take Claritin as needed. She is due for a colonoscopy next summer and a mammogram now, which will be scheduled today. She continues to take a potassium supplement and gabapentin at night for neuropathy pain in her feet, which she reports is helpful. She also reports smoking and is interested in quitting.       BP Readings from Last 3 Encounters:  05/07/23 117/72  12/07/22 122/72  11/09/22 138/80      Health Maintenance Due  Topic Date Due   Zoster Vaccines- Shingrix (1 of 2) Never done   COVID-19 Vaccine (6 - 2023-24 season) 11/26/2022    Past Medical History:  Diagnosis Date   Arthritis    back and knees   Cataract    High cholesterol    Hypertension    Neuromuscular disorder (HCC)    neuropathy   Neuropathy     Past Surgical History:  Procedure Laterality Date   ABDOMINAL HYSTERECTOMY     BREAST CYST EXCISION Bilateral    TOOTH EXTRACTION  04/28/2017    Family History  Problem Relation Age of Onset   Hypertension Mother    Hypertension Father    Cancer Sister 53       lymphoma    Hypertension Sister    Kidney disease Daughter    Stroke Daughter    Hypertension Sister     Hypertension Sister    Hypertension Brother    Hypertension Brother    Gout Brother    Hypertension Brother    Hypertension Brother    Colon cancer Neg Hx    Colon polyps Neg Hx    Esophageal cancer Neg Hx    Rectal cancer Neg Hx    Stomach cancer Neg Hx     Social History   Socioeconomic History   Marital status: Single    Spouse name: Not on file   Number of children: Not on file   Years of education: Not on file   Highest education level: Not on file  Occupational History   Not on file  Tobacco Use   Smoking status: Every Day    Current packs/day: 0.50    Average packs/day: 0.5 packs/day for 22.0 years (11.0 ttl pk-yrs)    Types: Cigarettes   Smokeless tobacco: Never   Tobacco comments:    5 cigarettes daily  Vaping Use   Vaping status: Never Used  Substance and Sexual Activity   Alcohol use: Yes    Alcohol/week: 0.0 standard drinks of alcohol    Comment: occasional   Drug use: No   Sexual activity: Never  Other Topics Concern   Not on file  Social History Narrative   Works in a Clinical research associate  factory   Single   7 children- one deceased shortly after birth   Lives alone but frequently has family visit   Has 7 grandchildren   Enjoys walking, playing grand kids, park   Dog- outdoor dog.     Social Determinants of Health   Financial Resource Strain: Low Risk  (07/08/2021)   Overall Financial Resource Strain (CARDIA)    Difficulty of Paying Living Expenses: Not hard at all  Food Insecurity: No Food Insecurity (07/08/2021)   Hunger Vital Sign    Worried About Running Out of Food in the Last Year: Never true    Ran Out of Food in the Last Year: Never true  Transportation Needs: No Transportation Needs (07/08/2021)   PRAPARE - Administrator, Civil Service (Medical): No    Lack of Transportation (Non-Medical): No  Physical Activity: Insufficiently Active (07/08/2021)   Exercise Vital Sign    Days of Exercise per Week: 2 days    Minutes of Exercise per  Session: 50 min  Stress: No Stress Concern Present (07/08/2021)   [employer] of Occupational Health - Occupational Stress Questionnaire    Feeling of Stress : Not at all  Social Connections: Socially Isolated (07/08/2021)   Social Connection and Isolation Panel [NHANES]    Frequency of Communication with Friends and Family: Once a week    Frequency of Social Gatherings with Friends and Family: Once a week    Attends Religious Services: Never    Database administrator or Organizations: No    Attends Banker Meetings: Never    Marital Status: Divorced  Catering manager Violence: Not At Risk (07/08/2021)   Humiliation, Afraid, Rape, and Kick questionnaire    Fear of Current or Ex-Partner: No    Emotionally Abused: No    Physically Abused: No    Sexually Abused: No    Outpatient Medications Prior to Visit  Medication Sig Dispense Refill   amLODipine (NORVASC) 5 MG tablet Take 1 tablet (5 mg total) by mouth daily. 90 tablet 1   atorvastatin (LIPITOR) 20 MG tablet Take 1 tablet (20 mg total) by mouth daily. 90 tablet 3   Calcium Carbonate-Vitamin D 600-400 MG-UNIT tablet Take 1 tablet by mouth 2 (two) times daily.     gabapentin (NEURONTIN) 300 MG capsule TAKE 1 CAPSULE(300 MG) BY MOUTH AT BEDTIME 90 capsule 1   loratadine (CLARITIN) 10 MG tablet Take 1 tablet (10 mg total) by mouth daily. 90 tablet 4   Multiple Vitamin (MULTIVITAMIN) tablet Take 1 tablet by mouth daily. One-a-day womens     potassium chloride SA (KLOR-CON M) 20 MEQ tablet TAKE 1 TABLET(20 MEQ) BY MOUTH DAILY 90 tablet 1   celecoxib (CELEBREX) 200 MG capsule Take 1 capsule (200 mg total) by mouth daily. 30 capsule 0   hydrochlorothiazide (HYDRODIURIL) 25 MG tablet TAKE 1 TABLET(25 MG) BY MOUTH DAILY 90 tablet 1   losartan (COZAAR) 100 MG tablet Take 1 tablet (100 mg total) by mouth daily. 90 tablet 1   No facility-administered medications prior to visit.    No Known Allergies  ROS     Objective:     Physical Exam Constitutional:      General: She is not in acute distress.    Appearance: Normal appearance. She is well-developed.  HENT:     Head: Normocephalic and atraumatic.     Right Ear: External ear normal.     Left Ear: External ear normal.  Eyes:  General: No scleral icterus. Neck:     Thyroid: No thyromegaly.  Cardiovascular:     Rate and Rhythm: Normal rate and regular rhythm.     Heart sounds: Normal heart sounds. No murmur heard. Pulmonary:     Effort: Pulmonary effort is normal. No respiratory distress.     Breath sounds: Examination of the right-upper field reveals wheezing and rhonchi. Examination of the right-middle field reveals rhonchi. Wheezing and rhonchi present.  Musculoskeletal:     Cervical back: Neck supple.  Skin:    General: Skin is warm and dry.  Neurological:     Mental Status: She is alert and oriented to person, place, and time.  Psychiatric:        Mood and Affect: Mood normal.        Behavior: Behavior normal.        Thought Content: Thought content normal.        Judgment: Judgment normal.      BP 117/72   Pulse 85   Temp 98 F (36.7 C)   Resp 18   Ht 5\' 1"  (1.549 m)   Wt 188 lb (85.3 kg)   SpO2 97%   BMI 35.52 kg/m  Wt Readings from Last 3 Encounters:  05/07/23 188 lb (85.3 kg)  12/07/22 190 lb (86.2 kg)  11/09/22 191 lb (86.6 kg)       Assessment & Plan:   Problem List Items Addressed This Visit       Unprioritized   Tobacco abuse     Smoking: Reports current smoking and interest in cessation. -Order lung cancer screening CT scan. -Prescribe Chantix starter pack and instruct patient to contact office before running out to update [place] on her progress and to obtain further refills. Instructed to complete full 12-week course.      Relevant Medications   Varenicline Tartrate, Starter, (CHANTIX STARTING MONTH PAK) 0.5 MG X 11 & 1 MG X 42 TBPK   Other Relevant Orders   CT CHEST LUNG CA SCREEN LOW DOSE W/O CM    Neuropathy     Reports benefit from Gabapentin for neuropathic pain in feet. -Continue Gabapentin. -Check B12 level due to association with neuropathy.      Relevant Orders   B12   Hyperlipidemia    Lab Results  Component Value Date   CHOL 169 12/18/2022   HDL 69.20 12/18/2022   LDLCALC 84 12/18/2022   TRIG 78.0 12/18/2022   CHOLHDL 2 12/18/2022  Last cholesterol check over winter was within target on Lipitor. -Continue Lipitor at current dose.       Relevant Medications   losartan (COZAAR) 100 MG tablet   hydrochlorothiazide (HYDRODIURIL) 25 MG tablet   Generalized OA     Reports no current use of Celebrex and symptoms are well managed. -No changes to current management.       Essential hypertension    Well controlled on Losartan 100mg , Hydrochlorothiazide 25mg , and Amlodipine 5mg . -Continue current medications.      Relevant Medications   losartan (COZAAR) 100 MG tablet   hydrochlorothiazide (HYDRODIURIL) 25 MG tablet   Other Relevant Orders   Basic Metabolic Panel (BMET)   Other Visit Diagnoses     Breast cancer screening by mammogram    -  Primary   Relevant Orders   MM 3D SCREENING MAMMOGRAM BILATERAL BREAST       I have discontinued Jaslene Lampton's celecoxib. I am also having her start on Varenicline Tartrate (Starter). Additionally, I am having  her maintain her multivitamin, Calcium Carbonate-Vitamin D, loratadine, atorvastatin, gabapentin, potassium chloride SA, amLODipine, losartan, and hydrochlorothiazide.  Meds ordered this encounter  Medications   losartan (COZAAR) 100 MG tablet    Sig: Take 1 tablet (100 mg total) by mouth daily.    Dispense:  90 tablet    Refill:  1    Order Specific Question:   Supervising Provider    Answer:   Danise Edge A [[id number]]   hydrochlorothiazide (HYDRODIURIL) 25 MG tablet    Sig: TAKE 1 TABLET(25 MG) BY MOUTH DAILY    Dispense:  90 tablet    Refill:  1    Order Specific Question:   Supervising Provider     Answer:   Danise Edge A [[id number]]   Varenicline Tartrate, Starter, (CHANTIX STARTING MONTH PAK) 0.5 MG X 11 & 1 MG X 42 TBPK    Sig: Take one 0.5 mg tablet by mouth once daily for 3 days, then increase to one 0.5 mg tablet twice daily for 4 days, then increase to one 1 mg tablet twice daily.    Dispense:  53 each    Refill:  0    Order Specific Question:   Supervising Provider    Answer:   Danise Edge A [[id number]]

## 2023-05-07 NOTE — Assessment & Plan Note (Signed)
  Smoking: Reports current smoking and interest in cessation. -Order lung cancer screening CT scan. -Prescribe Chantix starter pack and instruct patient to contact office before running out to update [place] on her progress and to obtain further refills. Instructed to complete full 12-week course.

## 2023-05-07 NOTE — Assessment & Plan Note (Signed)
Reports benefit from Gabapentin for neuropathic pain in feet. -Continue Gabapentin. -Check B12 level due to association with neuropathy.

## 2023-05-08 ENCOUNTER — Encounter: Payer: Self-pay | Admitting: Family

## 2023-05-10 NOTE — Progress Notes (Signed)
Letter mailed out. Done 

## 2023-05-17 ENCOUNTER — Ambulatory Visit (HOSPITAL_BASED_OUTPATIENT_CLINIC_OR_DEPARTMENT_OTHER)
Admission: RE | Admit: 2023-05-17 | Discharge: 2023-05-17 | Disposition: A | Discharge status: Home / Self Care | Payer: Medicare Other | Source: Ambulatory Visit | Attending: Family | Admitting: Family

## 2023-05-17 DIAGNOSIS — F17200 Nicotine dependence, unspecified, uncomplicated: Secondary | ICD-10-CM | POA: Diagnosis not present

## 2023-05-17 DIAGNOSIS — J438 Other emphysema: Secondary | ICD-10-CM | POA: Diagnosis not present

## 2023-05-17 DIAGNOSIS — Z122 Encounter for screening for malignant neoplasm of respiratory organs: Secondary | ICD-10-CM | POA: Insufficient documentation

## 2023-05-17 DIAGNOSIS — Z720 Tobacco use: Secondary | ICD-10-CM | POA: Insufficient documentation

## 2023-05-17 DIAGNOSIS — I2510 Atherosclerotic heart disease of native coronary artery without angina pectoris: Secondary | ICD-10-CM | POA: Insufficient documentation

## 2023-05-17 DIAGNOSIS — I700 Atherosclerosis of aorta: Secondary | ICD-10-CM | POA: Insufficient documentation

## 2023-05-17 DIAGNOSIS — J432 Centrilobular emphysema: Secondary | ICD-10-CM | POA: Insufficient documentation

## 2023-05-17 DIAGNOSIS — F17210 Nicotine dependence, cigarettes, uncomplicated: Secondary | ICD-10-CM | POA: Diagnosis not present

## 2023-05-17 DIAGNOSIS — R911 Solitary pulmonary nodule: Secondary | ICD-10-CM | POA: Diagnosis not present

## 2023-05-18 ENCOUNTER — Other Ambulatory Visit: Payer: Self-pay

## 2023-05-18 MED ORDER — GABAPENTIN 300 MG PO CAPS: ORAL_CAPSULE | ORAL | 1 refills | Status: DC

## 2023-05-24 ENCOUNTER — Encounter: Payer: Self-pay | Admitting: Family

## 2023-05-24 NOTE — Progress Notes (Signed)
Mailed out to patient 

## 2023-05-28 NOTE — Progress Notes (Addendum)
Hello,  Does the below not satisfy the documentation requirement?    Tobacco abuse         Smoking: Reports current smoking and interest in cessation. -Order lung cancer screening CT scan. -Prescribe Chantix starter pack and instruct patient to contact office before running out to update [place] on her progress and to obtain further refills. Instructed to complete full 12-week course.        Relevant Medications    Varenicline Tartrate, Starter, (CHANTIX STARTING MONTH PAK) 0.5 MG X 11 & 1 MG X 42 TBPK    Other Relevant Orders    CT CHEST LUNG CA SCREEN LOW DOSE W/O CM

## 2023-06-28 ENCOUNTER — Ambulatory Visit (HOSPITAL_BASED_OUTPATIENT_CLINIC_OR_DEPARTMENT_OTHER): Payer: Medicare Other

## 2023-06-29 ENCOUNTER — Ambulatory Visit (HOSPITAL_BASED_OUTPATIENT_CLINIC_OR_DEPARTMENT_OTHER)
Admission: RE | Admit: 2023-06-29 | Discharge: 2023-06-29 | Disposition: A | Discharge status: Home / Self Care | Payer: Medicare Other | Source: Ambulatory Visit | Attending: Family | Admitting: Family

## 2023-06-29 ENCOUNTER — Encounter (HOSPITAL_BASED_OUTPATIENT_CLINIC_OR_DEPARTMENT_OTHER): Payer: Self-pay

## 2023-06-29 DIAGNOSIS — Z1231 Encounter for screening mammogram for malignant neoplasm of breast: Secondary | ICD-10-CM | POA: Diagnosis not present

## 2023-07-21 ENCOUNTER — Ambulatory Visit: Payer: Medicare Other | Admitting: *Deleted

## 2023-07-21 DIAGNOSIS — Z0000 Encounter for general adult medical examination without abnormal findings: Secondary | ICD-10-CM

## 2023-07-21 NOTE — Patient Instructions (Signed)
Janice Meadows , Thank you for taking time to come for your Medicare Wellness Visit. I appreciate your ongoing commitment to your health goals. Please review the following plan we discussed and let me know if I can assist you in the future.   These are the goals we discussed:  Goals      Patient Stated     Drink more water     Quit Smoking        This is a list of the screening recommended for you and due dates:  Health Maintenance  Topic Date Due   Zoster (Shingles) Vaccine (1 of 2) Never done   Flu Shot  05/27/2023   COVID-19 Vaccine (6 - 2023-24 season) 06/27/2023   Colon Cancer Screening  04/07/2024   Medicare Annual Wellness Visit  07/20/2024   DTaP/Tdap/Td vaccine (2 - Td or Tdap) 03/03/2025   Mammogram  06/28/2025   Pneumonia Vaccine  Completed   DEXA scan (bone density measurement)  Completed   Hepatitis C Screening  Completed   HPV Vaccine  Aged Out     Next appointment: Follow up in one year for your annual wellness visit.   Preventive Care 27 Years and Older, Female Preventive care refers to lifestyle choices and visits with your health care provider that can promote health and wellness. What does preventive care include? A yearly physical exam. This is also called an annual well check. Dental exams once or twice a year. Routine eye exams. Ask your health care provider how often you should have your eyes checked. Personal lifestyle choices, including: Daily care of your teeth and gums. Regular physical activity. Eating a healthy diet. Avoiding tobacco and drug use. Limiting alcohol use. Practicing safe sex. Taking low-dose aspirin every day. Taking vitamin and mineral supplements as recommended by your health care provider. What happens during an annual well check? The services and screenings done by your health care provider during your annual well check will depend on your age, overall health, lifestyle risk factors, and family history of  disease. Counseling  Your health care provider may ask you questions about your: Alcohol use. Tobacco use. Drug use. Emotional well-being. Home and relationship well-being. Sexual activity. Eating habits. History of falls. Memory and ability to understand (cognition). Work and work Astronomer. Reproductive health. Screening  You may have the following tests or measurements: Height, weight, and BMI. Blood pressure. Lipid and cholesterol levels. These may be checked every 5 years, or more frequently if you are over 60 years old. Skin check. Lung cancer screening. You may have this screening every year starting at age 51 if you have a 30-pack-year history of smoking and currently smoke or have quit within the past 15 years. Fecal occult blood test (FOBT) of the stool. You may have this test every year starting at age 27. Flexible sigmoidoscopy or colonoscopy. You may have a sigmoidoscopy every 5 years or a colonoscopy every 10 years starting at age 62. Hepatitis C blood test. Hepatitis B blood test. Sexually transmitted disease (STD) testing. Diabetes screening. This is done by checking your blood sugar (glucose) after you have not eaten for a while (fasting). You may have this done every 1-3 years. Bone density scan. This is done to screen for osteoporosis. You may have this done starting at age 75. Mammogram. This may be done every 1-2 years. Talk to your health care provider about how often you should have regular mammograms. Talk with your health care provider about your test results,  treatment options, and if necessary, the need for more tests. Vaccines  Your health care provider may recommend certain vaccines, such as: Influenza vaccine. This is recommended every year. Tetanus, diphtheria, and acellular pertussis (Tdap, Td) vaccine. You may need a Td booster every 10 years. Zoster vaccine. You may need this after age 53. Pneumococcal 13-valent conjugate (PCV13) vaccine. One  dose is recommended after age 56. Pneumococcal polysaccharide (PPSV23) vaccine. One dose is recommended after age 35. Talk to your health care provider about which screenings and vaccines you need and how often you need them. This information is not intended to replace advice given to you by your health care provider. Make sure you discuss any questions you have with your health care provider. Document Released: 11/08/2015 Document Revised: 07/01/2016 Document Reviewed: 08/13/2015 Elsevier Interactive Patient Education  2017 [employer].  Fall Prevention in the Home Falls can cause injuries. They can happen to people of all ages. There are many things you can do to make your home safe and to help prevent falls. What can I do on the outside of my home? Regularly fix the edges of walkways and driveways and fix any cracks. Remove anything that might make you trip as you walk through a door, such as a raised step or threshold. Trim any bushes or trees on the path to your home. Use bright outdoor lighting. Clear any walking paths of anything that might make someone trip, such as rocks or tools. Regularly check to see if handrails are loose or broken. Make sure that both sides of any steps have handrails. Any raised decks and porches should have guardrails on the edges. Have any leaves, snow, or ice cleared regularly. Use sand or salt on walking paths during winter. Clean up any spills in your garage right away. This includes oil or grease spills. What can I do in the bathroom? Use night lights. Install grab bars by the toilet and in the tub and shower. Do not use towel bars as grab bars. Use non-skid mats or decals in the tub or shower. If you need to sit down in the shower, use a plastic, non-slip stool. Keep the floor dry. Clean up any water that spills on the floor as soon as it happens. Remove soap buildup in the tub or shower regularly. Attach bath mats securely with double-sided  non-slip rug tape. Do not have throw rugs and other things on the floor that can make you trip. What can I do in the bedroom? Use night lights. Make sure that you have a light by your bed that is easy to reach. Do not use any sheets or blankets that are too big for your bed. They should not hang down onto the floor. Have a firm chair that has side arms. You can use this for support while you get dressed. Do not have throw rugs and other things on the floor that can make you trip. What can I do in the kitchen? Clean up any spills right away. Avoid walking on wet floors. Keep items that you use a lot in easy-to-reach places. If you need to reach something above you, use a strong step stool that has a grab bar. Keep electrical cords out of the way. Do not use floor polish or wax that makes floors slippery. If you must use wax, use non-skid floor wax. Do not have throw rugs and other things on the floor that can make you trip. What can I do with my stairs? Do  not leave any items on the stairs. Make sure that there are handrails on both sides of the stairs and use them. Fix handrails that are broken or loose. Make sure that handrails are as long as the stairways. Check any carpeting to make sure that it is firmly attached to the stairs. Fix any carpet that is loose or worn. Avoid having throw rugs at the top or bottom of the stairs. If you do have throw rugs, attach them to the floor with carpet tape. Make sure that you have a light switch at the top of the stairs and the bottom of the stairs. If you do not have them, ask someone to add them for you. What else can I do to help prevent falls? Wear shoes that: Do not have high heels. Have rubber bottoms. Are comfortable and fit you well. Are closed at the toe. Do not wear sandals. If you use a stepladder: Make sure that it is fully opened. Do not climb a closed stepladder. Make sure that both sides of the stepladder are locked into place. Ask  someone to hold it for you, if possible. Clearly mark and make sure that you can see: Any grab bars or handrails. First and last steps. Where the edge of each step is. Use tools that help you move around (mobility aids) if they are needed. These include: Canes. Walkers. Scooters. Crutches. Turn on the lights when you go into a dark area. Replace any light bulbs as soon as they burn out. Set up your furniture so you have a clear path. Avoid moving your furniture around. If any of your floors are uneven, fix them. If there are any pets around you, be aware of where they are. Review your medicines with your doctor. Some medicines can make you feel dizzy. This can increase your chance of falling. Ask your doctor what other things that you can do to help prevent falls. This information is not intended to replace advice given to you by your health care provider. Make sure you discuss any questions you have with your health care provider. Document Released: 08/08/2009 Document Revised: 03/19/2016 Document Reviewed: 11/16/2014 Elsevier Interactive Patient Education  2017 [employer].

## 2023-07-21 NOTE — Progress Notes (Signed)
Subjective:   Janice Meadows is a 72 y.o. female who presents for Medicare Annual (Subsequent) preventive examination.  Visit Complete: Virtual  I connected with  Jerlyn Ly on 07/21/23 by a audio enabled telemedicine application and verified that I am speaking with the correct person using two identifiers.  Patient Location: Home  Provider Location: Office/Clinic  I discussed the limitations of evaluation and management by telemedicine. The patient expressed understanding and agreed to proceed.   Cardiac Risk Factors include: advanced age (>50men, >48 women);dyslipidemia;hypertension     Objective:    Because this visit was a virtual/telehealth visit, some criteria may be missing or patient reported. Any vitals not documented were not able to be obtained and vitals that have been documented are patient reported.        07/21/2023    8:30 AM 07/13/2022    8:25 AM 02/07/2022   11:08 AM 07/08/2021    1:11 PM 02/28/2019    8:15 AM 08/06/2017   10:08 AM 07/16/2017    1:24 PM  Advanced Directives  Does Patient Have a Medical Advance Directive? No No No No No No No  Would patient like information on creating a medical advance directive? No - Patient declined No - Patient declined No - Patient declined Yes (MAU/Ambulatory/Procedural Areas - Information given) Yes (MAU/Ambulatory/Procedural Areas - Information given)      Current Medications (verified) Outpatient Encounter Medications as of 07/21/2023  Medication Sig   amLODipine (NORVASC) 5 MG tablet Take 1 tablet (5 mg total) by mouth daily.   atorvastatin (LIPITOR) 20 MG tablet Take 1 tablet (20 mg total) by mouth daily.   Calcium Carbonate-Vitamin D 600-400 MG-UNIT tablet Take 1 tablet by mouth 2 (two) times daily.   gabapentin (NEURONTIN) 300 MG capsule TAKE 1 CAPSULE(300 MG) BY MOUTH AT BEDTIME   hydrochlorothiazide (HYDRODIURIL) 25 MG tablet TAKE 1 TABLET(25 MG) BY MOUTH DAILY   loratadine (CLARITIN) 10 MG tablet Take 1 tablet  (10 mg total) by mouth daily.   losartan (COZAAR) 100 MG tablet Take 1 tablet (100 mg total) by mouth daily.   Multiple Vitamin (MULTIVITAMIN) tablet Take 1 tablet by mouth daily. One-a-day womens   potassium chloride SA (KLOR-CON M) 20 MEQ tablet TAKE 1 TABLET(20 MEQ) BY MOUTH DAILY   [DISCONTINUED] Varenicline Tartrate, Starter, (CHANTIX STARTING MONTH PAK) 0.5 MG X 11 & 1 MG X 42 TBPK Take one 0.5 mg tablet by mouth once daily for 3 days, then increase to one 0.5 mg tablet twice daily for 4 days, then increase to one 1 mg tablet twice daily.   No facility-administered encounter medications on file as of 07/21/2023.    Allergies (verified) Patient has no known allergies.   History: Past Medical History:  Diagnosis Date   Arthritis    back and knees   Cataract    High cholesterol    Hypertension    Neuromuscular disorder (HCC)    neuropathy   Neuropathy    Past Surgical History:  Procedure Laterality Date   ABDOMINAL HYSTERECTOMY     BREAST CYST EXCISION Bilateral    TOOTH EXTRACTION  04/28/2017   Family History  Problem Relation Age of Onset   Hypertension Mother    Hypertension Father    Cancer Sister 54       lymphoma    Hypertension Sister    Kidney disease Daughter    Stroke Daughter    Hypertension Sister    Hypertension Sister    Hypertension Brother  Hypertension Brother    Gout Brother    Hypertension Brother    Hypertension Brother    Colon cancer Neg Hx    Colon polyps Neg Hx    Esophageal cancer Neg Hx    Rectal cancer Neg Hx    Stomach cancer Neg Hx    Social History   Socioeconomic History   Marital status: Single    Spouse name: Not on file   Number of children: Not on file   Years of education: Not on file   Highest education level: Not on file  Occupational History   Not on file  Tobacco Use   Smoking status: Every Day    Current packs/day: 0.50    Average packs/day: 0.5 packs/day for 22.0 years (11.0 ttl pk-yrs)    Types:  Cigarettes   Smokeless tobacco: Never   Tobacco comments:    5 cigarettes daily  Vaping Use   Vaping status: Never Used  Substance and Sexual Activity   Alcohol use: Yes    Alcohol/week: 0.0 standard drinks of alcohol    Comment: occasional   Drug use: No   Sexual activity: Never  Other Topics Concern   Not on file  Social History Narrative   Works in a [employer]   Single   7 children- one deceased shortly after birth   Lives alone but frequently has family visit   Has 7 grandchildren   Enjoys walking, playing grand kids, park   Dog- outdoor dog.     Social Determinants of Health   Financial Resource Strain: Medium Risk (07/21/2023)   Overall Financial Resource Strain (CARDIA)    Difficulty of Paying Living Expenses: Somewhat hard  Food Insecurity: No Food Insecurity (07/21/2023)   Hunger Vital Sign    Worried About Running Out of Food in the Last Year: Never true    Ran Out of Food in the Last Year: Never true  Transportation Needs: No Transportation Needs (07/21/2023)   PRAPARE - Administrator, Civil Service (Medical): No    Lack of Transportation (Non-Medical): No  Physical Activity: Sufficiently Active (07/21/2023)   Exercise Vital Sign    Days of Exercise per Week: 3 days    Minutes of Exercise per Session: 60 min  Stress: No Stress Concern Present (07/21/2023)   [employer] of Occupational Health - Occupational Stress Questionnaire    Feeling of Stress : Only a little  Social Connections: Moderately Isolated (07/21/2023)   Social Connection and Isolation Panel [NHANES]    Frequency of Communication with Friends and Family: More than three times a week    Frequency of Social Gatherings with Friends and Family: More than three times a week    Attends Religious Services: More than 4 times per year    Active Member of [employer] or Organizations: No    Attends Engineer, structural: Never    Marital Status: Divorced    Tobacco  Counseling Ready to quit: Not Answered Counseling given: Not Answered Tobacco comments: 5 cigarettes daily   Clinical Intake:  Pre-visit preparation completed: Yes  Pain : No/denies pain  Nutritional Risks: None Diabetes: No  How often do you need to have someone help you when you read instructions, pamphlets, or other written materials from your doctor or pharmacy?: 1 - Never  Interpreter Needed?: No  Information entered by :: [employer], CMA   Activities of Daily Living    07/21/2023    8:23 AM  In your present  state of health, do you have any difficulty performing the following activities:  Hearing? 0  Vision? 0  Difficulty concentrating or making decisions? 0  Walking or climbing stairs? 1  Dressing or bathing? 0  Doing errands, shopping? 0  Preparing Food and eating ? N  Using the Toilet? N  In the past six months, have you accidently leaked urine? N  Do you have problems with loss of bowel control? N  Managing your Medications? N  Managing your Finances? N  Housekeeping or managing your Housekeeping? N    Patient Care Team: Sandford Craze, NP as PCP - General (Internal Medicine) Nahser, Deloris Ping, MD as PCP - Cardiology (Cardiology)  Indicate any recent Medical Services you may have received from other than [facility] providers in the past year (date may be approximate).     Assessment:   This is a routine wellness examination for Janice Meadows.  Hearing/Vision screen No results found.   Goals Addressed   None    Depression Screen    07/21/2023    8:28 AM 07/13/2022    8:25 AM 07/08/2021    1:13 PM 04/16/2020    9:19 AM 02/28/2019    8:16 AM 06/22/2018    8:39 AM 05/12/2017   11:32 AM  PHQ 2/9 Scores  PHQ - 2 Score 0 0 0 0 0 0 0  PHQ- 9 Score      0     Fall Risk    07/21/2023    8:25 AM 07/13/2022    8:25 AM 07/08/2021    1:12 PM 04/16/2020    9:16 AM 02/28/2019    8:16 AM  Fall Risk   Falls in the past year? 0 0 0 0 0  Number falls in past yr: 0 0 0  0   Injury with Fall? 0 0 0    Risk for fall due to : No Fall Risks No Fall Risks     Follow up Falls evaluation completed Falls evaluation completed Falls prevention discussed      MEDICARE RISK AT HOME: Medicare Risk at Home Any stairs in or around the home?: No If so, are there any without handrails?: No Home free of loose throw rugs in walkways, pet beds, electrical cords, etc?: Yes Adequate lighting in your home to reduce risk of falls?: Yes Life alert?: No Use of a cane, walker or w/c?: No Grab bars in the bathroom?: Yes Shower chair or bench in shower?: No Elevated toilet seat or a handicapped toilet?: Yes  TIMED UP AND GO:  Was the test performed?  No    Cognitive Function:        07/21/2023    8:31 AM 07/13/2022    8:31 AM  6CIT Screen  What Year? 0 points 0 points  What month? 0 points 0 points  What time? 0 points 0 points  Count back from 20 0 points 0 points  Months in reverse 2 points 0 points  Repeat phrase 4 points 4 points  Total Score 6 points 4 points    Immunizations Immunization History  Administered Date(s) Administered   Fluad Quad(high Dose 65+) 07/25/2019, 07/17/2020, 08/13/2021, 07/13/2022   Influenza, High Dose Seasonal PF 09/14/2017, 08/12/2018   Influenza,inj,Quad PF,6+ Mos 09/09/2015, 07/31/2016   PFIZER Comirnaty(Gray Top)Covid-19 Tri-Sucrose Vaccine 02/13/2021   PFIZER(Purple Top)SARS-COV-2 Vaccination 02/10/2020, 03/05/2020   Pfizer Covid-19 Vaccine Bivalent Booster 23yrs & up 09/26/2021   Pfizer(Comirnaty)Fall Seasonal Vaccine 12 years and older 07/26/2022   Pneumococcal Conjugate-13 02/10/2017  Pneumococcal Polysaccharide-23 10/26/2012, 06/22/2018   Tdap 03/04/2015    TDAP status: Up to date  Flu Vaccine status: Due, Education has been provided regarding the importance of this vaccine. Advised may receive this vaccine at local pharmacy or Health Dept. Aware to provide a copy of the vaccination record if obtained from local  pharmacy or Health Dept. Verbalized acceptance and understanding.  Pneumococcal vaccine status: Up to date  Covid-19 vaccine status: Information provided on how to obtain vaccines.   Qualifies for Shingles Vaccine? Yes   Zostavax completed No   Shingrix Completed?: No.    Education has been provided regarding the importance of this vaccine. Patient has been advised to call insurance company to determine out of pocket expense if they have not yet received this vaccine. Advised may also receive vaccine at local pharmacy or Health Dept. Verbalized acceptance and understanding.  Screening Tests Health Maintenance  Topic Date Due   Zoster Vaccines- Shingrix (1 of 2) Never done   INFLUENZA VACCINE  05/27/2023   COVID-19 Vaccine (6 - 2023-24 season) 06/27/2023   Medicare Annual Wellness (AWV)  07/14/2023   Colonoscopy  04/07/2024   DTaP/Tdap/Td (2 - Td or Tdap) 03/03/2025   MAMMOGRAM  06/28/2025   Pneumonia Vaccine 22+ Years old  Completed   DEXA SCAN  Completed   Hepatitis C Screening  Completed   HPV VACCINES  Aged Out    Health Maintenance  Health Maintenance Due  Topic Date Due   Zoster Vaccines- Shingrix (1 of 2) Never done   INFLUENZA VACCINE  05/27/2023   COVID-19 Vaccine (6 - 2023-24 season) 06/27/2023   Medicare Annual Wellness (AWV)  07/14/2023    Colorectal cancer screening: Type of screening: Colonoscopy. Completed 04/07/21. Repeat every 3 years  Mammogram status: Completed 06/29/23. Repeat every year  Bone Density status: Completed 07/28/21. Results reflect: Bone density results: OSTEOPENIA. Repeat every 2 years.  Lung Cancer Screening: (Low Dose CT Chest recommended if Age 45-80 years, 20 pack-year currently smoking OR have quit w/in 15years.) does not qualify.   Additional Screening:  Hepatitis C Screening: does qualify; Completed 09/09/15  Vision Screening: Recommended annual ophthalmology exams for early detection of glaucoma and other disorders of the eye. Is  the patient up to date with their annual eye exam?  Yes  Who is the provider or what is the name of the office in which the patient attends annual eye exams? [facility] If pt is not established with a provider, would they like to be referred to a provider to establish care? No .   Dental Screening: Recommended annual dental exams for proper oral hygiene  Diabetic Foot Exam: N/a  Community Resource Referral / Chronic Care Management: CRR required this visit?  No   CCM required this visit?  No     Plan:     I have personally reviewed and noted the following in the patient's chart:   Medical and social history Use of alcohol, tobacco or illicit drugs  Current medications and supplements including opioid prescriptions. Patient is not currently taking opioid prescriptions. Functional ability and status Nutritional status Physical activity Advanced directives List of other physicians Hospitalizations, surgeries, and ER visits in previous 12 months Vitals Screenings to include cognitive, depression, and falls Referrals and appointments  In addition, I have reviewed and discussed with patient certain preventive protocols, quality metrics, and best practice recommendations. A written personalized care plan for preventive services as well as general preventive health recommendations were provided to patient.  Donne Anon, CMA   07/21/2023   After Visit Summary: (Declined) Due to this being a telephonic visit, with patients personalized plan was offered to patient but patient Declined AVS at this time   Nurse Notes: None

## 2023-08-09 ENCOUNTER — Ambulatory Visit: Payer: Medicare Other | Admitting: Family

## 2023-08-10 ENCOUNTER — Ambulatory Visit (INDEPENDENT_AMBULATORY_CARE_PROVIDER_SITE_OTHER): Payer: Medicare Other | Admitting: Family

## 2023-08-10 VITALS — BP 122/67 | HR 72 | Temp 97.6°F | Resp 16 | Ht 61.0 in | Wt 187.0 lb

## 2023-08-10 DIAGNOSIS — E785 Hyperlipidemia, unspecified: Secondary | ICD-10-CM

## 2023-08-10 DIAGNOSIS — Z720 Tobacco use: Secondary | ICD-10-CM

## 2023-08-10 DIAGNOSIS — I10 Essential (primary) hypertension: Secondary | ICD-10-CM | POA: Diagnosis not present

## 2023-08-10 DIAGNOSIS — G629 Polyneuropathy, unspecified: Secondary | ICD-10-CM | POA: Diagnosis not present

## 2023-08-10 DIAGNOSIS — Z23 Encounter for immunization: Secondary | ICD-10-CM | POA: Diagnosis not present

## 2023-08-10 LAB — BASIC METABOLIC PANEL
BUN: 11 mg/dL (ref 6–23)
CO2: 28 meq/L (ref 19–32)
Calcium: 8.9 mg/dL (ref 8.4–10.5)
Chloride: 105 meq/L (ref 96–112)
Creatinine, Ser: 0.87 mg/dL (ref 0.40–1.20)
GFR: 66.86 mL/min (ref >60.00)
Glucose, Bld: 88 mg/dL (ref 70–99)
Potassium: 4.2 meq/L (ref 3.5–5.1)
Sodium: 141 meq/L (ref 135–145)

## 2023-08-10 NOTE — Patient Instructions (Signed)
VISIT SUMMARY:  During your recent visit, we discussed your ongoing health conditions, including hypertension, peripheral neuropathy, hyperlipidemia, and your efforts to quit smoking. You reported that your medications are working well, and you've made progress in reducing your smoking. We also discussed your need for a flu shot and a COVID-19 booster shot.  YOUR PLAN:  -TOBACCO USE DISORDER: You've made progress in reducing your smoking. Keep up the good work and continue your efforts to quit smoking completely.  -HYPERTENSION: Your blood pressure is well controlled with your current medications (Losartan, Amlodipine, and Hydrochlorothiazide). Continue taking these as prescribed.  -PERIPHERAL NEUROPATHY: You've reported relief from the burning pain in your feet with Gabapentin. Continue taking this medication as needed.  -HYPERLIPIDEMIA: Your cholesterol levels are within the target range on Atorvastatin. Continue taking this medication as prescribed.  -HYPOKALEMIA: You're taking a daily Potassium supplement. We'll check your Potassium level today to ensure it's within the normal range.  -GENERAL HEALTH MAINTENANCE: We administered your flu shot today. I recommend getting your COVID-19 booster shot at your local pharmacy. We'll schedule a follow-up visit in 6 months.  INSTRUCTIONS:  Please continue taking your medications as prescribed. Remember to get your COVID-19 booster shot at your local pharmacy. We'll check your Potassium level today, and we've scheduled a follow-up visit in 6 months.

## 2023-08-10 NOTE — Assessment & Plan Note (Signed)
  Well controlled on Losartan, Amlodipine, and Hydrochlorothiazide. Blood pressure today 122/67. -Continue current medications.

## 2023-08-10 NOTE — Assessment & Plan Note (Signed)
  Last cholesterol check was within target on Atorvastatin. -Continue Atorvastatin.

## 2023-08-10 NOTE — Assessment & Plan Note (Signed)
  Patient reports decreased smoking, attempting cessation without Chantix. -Encourage continued efforts towards cessation.

## 2023-08-10 NOTE — Progress Notes (Signed)
Subjective:     Patient ID: Janice Meadows, female    DOB: Feb 29, 1951, 72 y.o.   MRN: [id number]  Chief Complaint  Patient presents with   Hypertension    Here for follow up      Discussed the use of AI scribe software for clinical note transcription with the patient, who gave verbal consent to proceed.  History of Present Illness    The patient, with a history of hypertension, hyperlipidemia, and peripheral neuropathy, presents for a routine follow-up. She reports adherence to her current medication regimen, including losartan, amlodipine, hydrochlorothiazide, gabapentin, Kdur, and atorvastatin. She notes that the gabapentin has been effective in managing the burning pain in her feet.  The patient has been making efforts to quit smoking, with some success. She reports a reduction in smoking frequency, alternating between smoking a little and not smoking at all. She has chosen to quit smoking without the aid of Chantix.  The patient also reports that she is due for a flu shot and a COVID-19 booster shot. She expresses understanding that the COVID-19 vaccine will likely become an annual vaccine, similar to the flu shot.          Health Maintenance Due  Topic Date Due   Zoster Vaccines- Shingrix (1 of 2) Never done   INFLUENZA VACCINE  05/27/2023   COVID-19 Vaccine (6 - 2023-24 season) 06/27/2023    Past Medical History:  Diagnosis Date   Arthritis    back and knees   Cataract    High cholesterol    Hypertension    Neuromuscular disorder (HCC)    neuropathy   Neuropathy     Past Surgical History:  Procedure Laterality Date   ABDOMINAL HYSTERECTOMY     BREAST CYST EXCISION Bilateral    TOOTH EXTRACTION  04/28/2017    Family History  Problem Relation Age of Onset   Hypertension Mother    Hypertension Father    Cancer Sister 79       lymphoma    Hypertension Sister    Kidney disease Daughter    Stroke Daughter    Hypertension Sister    Hypertension Sister     Hypertension Brother    Hypertension Brother    Gout Brother    Hypertension Brother    Hypertension Brother    Colon cancer Neg Hx    Colon polyps Neg Hx    Esophageal cancer Neg Hx    Rectal cancer Neg Hx    Stomach cancer Neg Hx     Social History   Socioeconomic History   Marital status: Single    Spouse name: Not on file   Number of children: Not on file   Years of education: Not on file   Highest education level: Not on file  Occupational History   Not on file  Tobacco Use   Smoking status: Every Day    Current packs/day: 0.50    Average packs/day: 0.5 packs/day for 22.0 years (11.0 ttl pk-yrs)    Types: Cigarettes   Smokeless tobacco: Never   Tobacco comments:    5 cigarettes daily  Vaping Use   Vaping status: Never Used  Substance and Sexual Activity   Alcohol use: Yes    Alcohol/week: 0.0 standard drinks of alcohol    Comment: occasional   Drug use: No   Sexual activity: Never  Other Topics Concern   Not on file  Social History Narrative   Works in a [employer]   Single  7 children- one deceased shortly after birth   Lives alone but frequently has family visit   Has 7 grandchildren   Enjoys walking, playing grand kids, park   Dog- outdoor dog.     Social Determinants of Health   Financial Resource Strain: Medium Risk (07/21/2023)   Overall Financial Resource Strain (CARDIA)    Difficulty of Paying Living Expenses: Somewhat hard  Food Insecurity: No Food Insecurity (07/21/2023)   Hunger Vital Sign    Worried About Running Out of Food in the Last Year: Never true    Ran Out of Food in the Last Year: Never true  Transportation Needs: No Transportation Needs (07/21/2023)   PRAPARE - Administrator, Civil Service (Medical): No    Lack of Transportation (Non-Medical): No  Physical Activity: Sufficiently Active (07/21/2023)   Exercise Vital Sign    Days of Exercise per Week: 3 days    Minutes of Exercise per Session: 60 min  Stress: No  Stress Concern Present (07/21/2023)   [employer] of Occupational Health - Occupational Stress Questionnaire    Feeling of Stress : Only a little  Social Connections: Moderately Isolated (07/21/2023)   Social Connection and Isolation Panel [NHANES]    Frequency of Communication with Friends and Family: More than three times a week    Frequency of Social Gatherings with Friends and Family: More than three times a week    Attends Religious Services: More than 4 times per year    Active Member of [employer] or Organizations: No    Attends Banker Meetings: Never    Marital Status: Divorced  Catering manager Violence: Not At Risk (07/21/2023)   Humiliation, Afraid, Rape, and Kick questionnaire    Fear of Current or Ex-Partner: No    Emotionally Abused: No    Physically Abused: No    Sexually Abused: No    Outpatient Medications Prior to Visit  Medication Sig Dispense Refill   amLODipine (NORVASC) 5 MG tablet Take 1 tablet (5 mg total) by mouth daily. 90 tablet 1   atorvastatin (LIPITOR) 20 MG tablet Take 1 tablet (20 mg total) by mouth daily. 90 tablet 3   Calcium Carbonate-Vitamin D 600-400 MG-UNIT tablet Take 1 tablet by mouth 2 (two) times daily.     gabapentin (NEURONTIN) 300 MG capsule TAKE 1 CAPSULE(300 MG) BY MOUTH AT BEDTIME 90 capsule 1   hydrochlorothiazide (HYDRODIURIL) 25 MG tablet TAKE 1 TABLET(25 MG) BY MOUTH DAILY 90 tablet 1   loratadine (CLARITIN) 10 MG tablet Take 1 tablet (10 mg total) by mouth daily. 90 tablet 4   losartan (COZAAR) 100 MG tablet Take 1 tablet (100 mg total) by mouth daily. 90 tablet 1   Multiple Vitamin (MULTIVITAMIN) tablet Take 1 tablet by mouth daily. One-a-day womens     potassium chloride SA (KLOR-CON M) 20 MEQ tablet TAKE 1 TABLET(20 MEQ) BY MOUTH DAILY 90 tablet 1   No facility-administered medications prior to visit.    No Known Allergies  ROS     Objective:    Physical Exam Constitutional:      General: She is not in  acute distress.    Appearance: Normal appearance. She is well-developed.  HENT:     Head: Normocephalic and atraumatic.     Right Ear: External ear normal.     Left Ear: External ear normal.  Eyes:     General: No scleral icterus. Neck:     Thyroid: No thyromegaly.  Cardiovascular:  Rate and Rhythm: Normal rate and regular rhythm.     Heart sounds: Normal heart sounds. No murmur heard. Pulmonary:     Effort: Pulmonary effort is normal. No respiratory distress.     Breath sounds: Normal breath sounds. No wheezing.  Musculoskeletal:     Cervical back: Neck supple.  Skin:    General: Skin is warm and dry.  Neurological:     Mental Status: She is alert and oriented to person, place, and time.  Psychiatric:        Mood and Affect: Mood normal.        Behavior: Behavior normal.        Thought Content: Thought content normal.        Judgment: Judgment normal.      BP 122/67 (BP Location: Right Arm, Patient Position: Sitting, Cuff Size: Small)   Pulse 72   Temp 97.6 F (36.4 C) (Oral)   Resp 16   Ht 5\' 1"  (1.549 m)   Wt 187 lb (84.8 kg)   SpO2 99%   BMI 35.33 kg/m  Wt Readings from Last 3 Encounters:  08/10/23 187 lb (84.8 kg)  05/07/23 188 lb (85.3 kg)  12/07/22 190 lb (86.2 kg)       Assessment & Plan:   Problem List Items Addressed This Visit       Unprioritized   Tobacco abuse     Patient reports decreased smoking, attempting cessation without Chantix. -Encourage continued efforts towards cessation.       Neuropathy     Reports relief of burning foot pain with daily Gabapentin. -Continue Gabapentin at bedtime.       Hyperlipidemia     Last cholesterol check was within target on Atorvastatin. -Continue Atorvastatin.       Essential hypertension - Primary     Well controlled on Losartan, Amlodipine, and Hydrochlorothiazide. Blood pressure today 122/67. -Continue current medications.      Relevant Orders   Basic Metabolic Panel (BMET)     I am having Jerlyn Ly maintain her multivitamin, Calcium Carbonate-Vitamin D, loratadine, atorvastatin, potassium chloride SA, amLODipine, losartan, hydrochlorothiazide, and gabapentin.  No orders of the defined types were placed in this encounter.

## 2023-08-10 NOTE — Assessment & Plan Note (Signed)
  Reports relief of burning foot pain with daily Gabapentin. -Continue Gabapentin at bedtime.

## 2023-11-10 ENCOUNTER — Ambulatory Visit: Payer: Medicare Other | Admitting: Family

## 2023-11-10 VITALS — BP 111/65 | HR 80 | Temp 97.7°F | Resp 16 | Ht 61.0 in | Wt 186.0 lb

## 2023-11-10 DIAGNOSIS — E785 Hyperlipidemia, unspecified: Secondary | ICD-10-CM | POA: Diagnosis not present

## 2023-11-10 DIAGNOSIS — I10 Essential (primary) hypertension: Secondary | ICD-10-CM

## 2023-11-10 DIAGNOSIS — G629 Polyneuropathy, unspecified: Secondary | ICD-10-CM | POA: Diagnosis not present

## 2023-11-10 DIAGNOSIS — Z720 Tobacco use: Secondary | ICD-10-CM | POA: Diagnosis not present

## 2023-11-10 LAB — COMPREHENSIVE METABOLIC PANEL
ALT: 12 U/L (ref 0–35)
AST: 21 U/L (ref 0–37)
Albumin: 3.5 g/dL (ref 3.5–5.2)
Alkaline Phosphatase: 114 U/L (ref 39–117)
BUN: 11 mg/dL (ref 6–23)
CO2: 28 meq/L (ref 19–32)
Calcium: 8.9 mg/dL (ref 8.4–10.5)
Chloride: 105 meq/L (ref 96–112)
Creatinine, Ser: 0.86 mg/dL (ref 0.40–1.20)
GFR: 67.68 mL/min (ref >60.00)
Glucose, Bld: 88 mg/dL (ref 70–99)
Potassium: 4 meq/L (ref 3.5–5.1)
Sodium: 141 meq/L (ref 135–145)
Total Bilirubin: 0.4 mg/dL (ref 0.2–1.2)
Total Protein: 6.9 g/dL (ref 6.0–8.3)

## 2023-11-10 LAB — LIPID PANEL
Cholesterol: 162 mg/dL (ref 0–200)
HDL: 63.2 mg/dL (ref >39.00)
LDL Cholesterol: 85 mg/dL (ref 0–99)
NonHDL: 98.67
Total CHOL/HDL Ratio: 3
Triglycerides: 70 mg/dL (ref 0.0–149.0)
VLDL: 14 mg/dL (ref 0.0–40.0)

## 2023-11-10 MED ORDER — SHINGRIX 50 MCG/0.5ML IM SUSR: INTRAMUSCULAR | 1 refills | Status: DC

## 2023-11-10 NOTE — Assessment & Plan Note (Signed)
 Lab Results  Component Value Date   CHOL 169 12/18/2022   HDL 69.20 12/18/2022   LDLCALC 84 12/18/2022   TRIG 78.0 12/18/2022   CHOLHDL 2 12/18/2022   Update lipids, continue atorvastatin .

## 2023-11-10 NOTE — Progress Notes (Signed)
 Subjective:     Patient ID: Janice Meadows, female    DOB: 10-14-1951, 73 y.o.   MRN: [id number]  Chief Complaint  Patient presents with   Hypertension    Here for follow up    HPI  Discussed the use of AI scribe software for clinical note transcription with the patient, who gave verbal consent to proceed.  History of Present Illness   The patient, with a history of hypertension, hyperlipidemia, and neuropathy, presents for a routine follow-up. She reports no new concerns. She continues to smoke, albeit less frequently, sometimes going two days without smoking. She is on losartan , hydrochlorothiazide , atorvastatin , potassium, and gabapentin , which continues to be effective for her neuropathy. She occasionally takes Claritin . She has not yet received the shingles vaccine or the updated COVID booster.          Health Maintenance Due  Topic Date Due   Zoster Vaccines- Shingrix  (1 of 2) Never done   COVID-19 Vaccine (6 - 2024-25 season) 06/27/2023    Past Medical History:  Diagnosis Date   Arthritis    back and knees   Cataract    High cholesterol    Hypertension    Neuromuscular disorder (HCC)    neuropathy   Neuropathy     Past Surgical History:  Procedure Laterality Date   ABDOMINAL HYSTERECTOMY     BREAST CYST EXCISION Bilateral    TOOTH EXTRACTION  04/28/2017    Family History  Problem Relation Age of Onset   Hypertension Mother    Hypertension Father    Cancer Sister 29       lymphoma    Hypertension Sister    Kidney disease Daughter    Stroke Daughter    Hypertension Sister    Hypertension Sister    Hypertension Brother    Hypertension Brother    Gout Brother    Hypertension Brother    Hypertension Brother    Colon cancer Neg Hx    Colon polyps Neg Hx    Esophageal cancer Neg Hx    Rectal cancer Neg Hx    Stomach cancer Neg Hx     Social History   Socioeconomic History   Marital status: Single    Spouse name: Not on file   Number of  children: Not on file   Years of education: Not on file   Highest education level: Not on file  Occupational History   Not on file  Tobacco Use   Smoking status: Every Day    Current packs/day: 0.50    Average packs/day: 0.5 packs/day for 22.0 years (11.0 ttl pk-yrs)    Types: Cigarettes   Smokeless tobacco: Never   Tobacco comments:    5 cigarettes daily  Vaping Use   Vaping status: Never Used  Substance and Sexual Activity   Alcohol use: Yes    Alcohol/week: 0.0 standard drinks of alcohol    Comment: occasional   Drug use: No   Sexual activity: Never  Other Topics Concern   Not on file  Social History Narrative   Works in a [employer]   Single   7 children- one deceased shortly after birth   Lives alone but frequently has family visit   Has 7 grandchildren   Enjoys walking, playing grand kids, park   Dog- outdoor dog.     Social Drivers of Health   Financial Resource Strain: Medium Risk (07/21/2023)   Overall Financial Resource Strain (CARDIA)    Difficulty of Paying  Living Expenses: Somewhat hard  Food Insecurity: No Food Insecurity (07/21/2023)   Hunger Vital Sign    Worried About Running Out of Food in the Last Year: Never true    Ran Out of Food in the Last Year: Never true  Transportation Needs: No Transportation Needs (07/21/2023)   PRAPARE - Administrator, Civil Service (Medical): No    Lack of Transportation (Non-Medical): No  Physical Activity: Sufficiently Active (07/21/2023)   Exercise Vital Sign    Days of Exercise per Week: 3 days    Minutes of Exercise per Session: 60 min  Stress: No Stress Concern Present (07/21/2023)   [employer] of Occupational Health - Occupational Stress Questionnaire    Feeling of Stress : Only a little  Social Connections: Moderately Isolated (07/21/2023)   Social Connection and Isolation Panel [NHANES]    Frequency of Communication with Friends and Family: More than three times a week    Frequency of  Social Gatherings with Friends and Family: More than three times a week    Attends Religious Services: More than 4 times per year    Active Member of [employer] or Organizations: No    Attends Banker Meetings: Never    Marital Status: Divorced  Catering manager Violence: Not At Risk (07/21/2023)   Humiliation, Afraid, Rape, and Kick questionnaire    Fear of Current or Ex-Partner: No    Emotionally Abused: No    Physically Abused: No    Sexually Abused: No    Outpatient Medications Prior to Visit  Medication Sig Dispense Refill   amLODipine  (NORVASC ) 5 MG tablet Take 1 tablet (5 mg total) by mouth daily. 90 tablet 1   atorvastatin  (LIPITOR) 20 MG tablet Take 1 tablet (20 mg total) by mouth daily. 90 tablet 3   Calcium  Carbonate-Vitamin D  600-400 MG-UNIT tablet Take 1 tablet by mouth 2 (two) times daily.     gabapentin  (NEURONTIN ) 300 MG capsule TAKE 1 CAPSULE(300 MG) BY MOUTH AT BEDTIME 90 capsule 1   hydrochlorothiazide  (HYDRODIURIL ) 25 MG tablet TAKE 1 TABLET(25 MG) BY MOUTH DAILY 90 tablet 1   loratadine  (CLARITIN ) 10 MG tablet Take 1 tablet (10 mg total) by mouth daily. 90 tablet 4   losartan  (COZAAR ) 100 MG tablet Take 1 tablet (100 mg total) by mouth daily. 90 tablet 1   Multiple Vitamin (MULTIVITAMIN) tablet Take 1 tablet by mouth daily. One-a-day womens     potassium chloride  SA (KLOR-CON  M) 20 MEQ tablet TAKE 1 TABLET(20 MEQ) BY MOUTH DAILY 90 tablet 1   No facility-administered medications prior to visit.    No Known Allergies  ROS     Objective:    Physical Exam Constitutional:      Appearance: Normal appearance. She is well-developed.  HENT:     Head: Normocephalic and atraumatic.  Eyes:     Extraocular Movements: Extraocular movements intact.  Cardiovascular:     Rate and Rhythm: Normal rate and regular rhythm.     Heart sounds: Normal heart sounds. No murmur heard. Pulmonary:     Effort: Pulmonary effort is normal. No respiratory distress.      Breath sounds: Normal breath sounds. No wheezing.  Skin:    General: Skin is warm.  Neurological:     Mental Status: She is alert and oriented to person, place, and time.  Psychiatric:        Behavior: Behavior normal.        Thought Content: Thought content normal.  Judgment: Judgment normal.      BP 111/65   Pulse 80   Temp 97.7 F (36.5 C) (Oral)   Resp 16   Ht 5\' 1"  (1.549 m)   Wt 186 lb (84.4 kg)   SpO2 100%   BMI 35.14 kg/m  Wt Readings from Last 3 Encounters:  11/10/23 186 lb (84.4 kg)  08/10/23 187 lb (84.8 kg)  05/07/23 188 lb (85.3 kg)       Assessment & Plan:   Problem List Items Addressed This Visit       Unprioritized   Tobacco abuse - Primary   Encouraged pt to continue her efforts to quit completely.       Neuropathy   Stable/improved, continue gabapentin         Hyperlipidemia   Lab Results  Component Value Date   CHOL 169 12/18/2022   HDL 69.20 12/18/2022   LDLCALC 84 12/18/2022   TRIG 78.0 12/18/2022   CHOLHDL 2 12/18/2022   Update lipids, continue atorvastatin .       Relevant Orders   Lipid panel   Comp Met (CMET)   Essential hypertension   Stable, continue losartan  and hydrochlorothiazide          I am having Baeleigh Jankovich start on Shingrix . I am also having her maintain her multivitamin, Calcium  Carbonate-Vitamin D , loratadine , atorvastatin , potassium chloride  SA, amLODipine , losartan , hydrochlorothiazide , and gabapentin .  Meds ordered this encounter  Medications   Zoster Vaccine Adjuvanted (SHINGRIX ) injection    Sig: 0.5mg  IM now, repeat in 2-6 months    Dispense:  0.5 mL    Refill:  1    Supervising Provider:   Randie Bustle A [[id number]]

## 2023-11-10 NOTE — Assessment & Plan Note (Signed)
 Encouraged pt to continue her efforts to quit completely.

## 2023-11-10 NOTE — Assessment & Plan Note (Signed)
 Stable/improved, continue gabapentin 

## 2023-11-10 NOTE — Patient Instructions (Signed)
 VISIT SUMMARY:  You came in today for a routine follow-up appointment. You reported no new concerns and mentioned that you are smoking less frequently. Your current medications are effectively managing your conditions, and we discussed some vaccinations you need to catch up on.  YOUR PLAN:  -TOBACCO USE: You have decreased the frequency of your smoking, which is a positive step. Please continue your efforts to quit smoking entirely.  -HYPERTENSION: Your high blood pressure is well controlled with your current medications, Losartan  and Hydrochlorothiazide . Please continue taking these medications as prescribed.  -HYPERLIPIDEMIA: High cholesterol can lead to heart disease. Your last cholesterol check was a year ago, so we will check your cholesterol levels today to ensure your medication, Atorvastatin , is working effectively.  -PERIPHERAL NEUROPATHY: This condition causes nerve pain, and you are currently managing it well with Gabapentin . Please continue taking Gabapentin  as prescribed.  -VACCINATIONS: You have not yet received the Shingles vaccine or the updated COVID booster. We will send a prescription for the Shingles vaccine to your pharmacy, and you are encouraged to get the updated COVID booster at the pharmacy.  INSTRUCTIONS:  Please follow up in 6 months or as needed. Make sure to get your cholesterol checked today and consider getting the Shingles vaccine and updated COVID booster at your pharmacy.

## 2023-11-10 NOTE — Assessment & Plan Note (Signed)
 Stable, continue losartan and hydrochlorothiazide.

## 2023-12-27 ENCOUNTER — Other Ambulatory Visit: Payer: Self-pay | Admitting: Family

## 2023-12-27 DIAGNOSIS — E785 Hyperlipidemia, unspecified: Secondary | ICD-10-CM

## 2023-12-29 ENCOUNTER — Other Ambulatory Visit: Payer: Self-pay | Admitting: Family

## 2023-12-29 DIAGNOSIS — I10 Essential (primary) hypertension: Secondary | ICD-10-CM

## 2024-01-20 ENCOUNTER — Other Ambulatory Visit: Payer: Self-pay | Admitting: Family

## 2024-04-20 ENCOUNTER — Telehealth: Payer: Self-pay | Admitting: Family

## 2024-04-20 MED ORDER — LOSARTAN POTASSIUM 100 MG PO TABS: 100.0000 mg | ORAL_TABLET | Freq: Every day | ORAL | 0 refills | Status: DC

## 2024-04-20 NOTE — Telephone Encounter (Signed)
 Copied from CRM [id number]. Topic: Clinical - Medication Refill >> Apr 20, 2024 10:12 AM Tiffini S wrote: Medication: losartan  (COZAAR ) 100 MG tablet  Has the patient contacted their pharmacy? Yes, called in last Thursday, that the pharmacy haven't received a answer from the office   (Agent: If no, request that the patient contact the pharmacy for the refill. If patient does not wish to contact the pharmacy document the reason why and proceed with request.) (Agent: If yes, when and what did the pharmacy advise?)  This is the patient's preferred pharmacy:  [id number] - [place], [place] - [address] AT [facility] OF [address] [place] [id number] Phone: [phone] Fax: [phone]  Is this the correct pharmacy for this prescription? Yes If no, delete pharmacy and type the correct one.   Has the prescription been filled recently? Yes  Is the patient out of the medication? Yes, patient took the last pill on last Thursday   Has the patient been seen for an appointment in the last year OR does the patient have an upcoming appointment? Yes  Can we respond through MyChart? Yes  Agent: Please be advised that Rx refills may take up to 3 business days. We ask that you follow-up with your pharmacy.

## 2024-05-09 ENCOUNTER — Other Ambulatory Visit (HOSPITAL_BASED_OUTPATIENT_CLINIC_OR_DEPARTMENT_OTHER): Payer: Self-pay

## 2024-05-09 ENCOUNTER — Ambulatory Visit: Payer: Self-pay | Admitting: Family

## 2024-05-09 ENCOUNTER — Ambulatory Visit (INDEPENDENT_AMBULATORY_CARE_PROVIDER_SITE_OTHER): Payer: Medicare Other | Admitting: Family

## 2024-05-09 VITALS — BP 93/60 | HR 78 | Temp 98.7°F | Resp 16 | Ht 61.0 in | Wt 184.0 lb

## 2024-05-09 DIAGNOSIS — Z1211 Encounter for screening for malignant neoplasm of colon: Secondary | ICD-10-CM

## 2024-05-09 DIAGNOSIS — M7581 Other shoulder lesions, right shoulder: Secondary | ICD-10-CM | POA: Diagnosis not present

## 2024-05-09 DIAGNOSIS — E785 Hyperlipidemia, unspecified: Secondary | ICD-10-CM

## 2024-05-09 DIAGNOSIS — G629 Polyneuropathy, unspecified: Secondary | ICD-10-CM | POA: Diagnosis not present

## 2024-05-09 DIAGNOSIS — Z8619 Personal history of other infectious and parasitic diseases: Secondary | ICD-10-CM | POA: Diagnosis not present

## 2024-05-09 DIAGNOSIS — I10 Essential (primary) hypertension: Secondary | ICD-10-CM | POA: Diagnosis not present

## 2024-05-09 DIAGNOSIS — Z720 Tobacco use: Secondary | ICD-10-CM

## 2024-05-09 LAB — BASIC METABOLIC PANEL WITH GFR
BUN: 8 mg/dL (ref 6–23)
CO2: 29 meq/L (ref 19–32)
Calcium: 9.1 mg/dL (ref 8.4–10.5)
Chloride: 107 meq/L (ref 96–112)
Creatinine, Ser: 0.89 mg/dL (ref 0.40–1.20)
GFR: 64.72 mL/min (ref >60.00)
Glucose, Bld: 91 mg/dL (ref 70–99)
Potassium: 4.2 meq/L (ref 3.5–5.1)
Sodium: 143 meq/L (ref 135–145)

## 2024-05-09 MED ORDER — AMLODIPINE BESYLATE 2.5 MG PO TABS: 2.5000 mg | ORAL_TABLET | Freq: Every day | ORAL | 1 refills | Status: DC

## 2024-05-09 MED ORDER — LOSARTAN POTASSIUM 100 MG PO TABS: 100.0000 mg | ORAL_TABLET | Freq: Every day | ORAL | 1 refills | Status: DC

## 2024-05-09 MED ORDER — ATORVASTATIN CALCIUM 20 MG PO TABS: 20.0000 mg | ORAL_TABLET | Freq: Every day | ORAL | 1 refills | Status: DC

## 2024-05-09 MED ORDER — HYDROCHLOROTHIAZIDE 25 MG PO TABS: 25.0000 mg | ORAL_TABLET | Freq: Every day | ORAL | 1 refills | Status: DC

## 2024-05-09 MED ORDER — SHINGRIX 50 MCG/0.5ML IM SUSR
0.5000 mL | Freq: Once | INTRAMUSCULAR | 1 refills | Status: AC
  Filled 2024-05-09: qty 0.5, 1d supply, fill #0
  Filled 2024-05-09: qty 0.5, fill #0

## 2024-05-09 MED ORDER — GABAPENTIN 300 MG PO CAPS: 300.0000 mg | ORAL_CAPSULE | Freq: Every day | ORAL | 1 refills | Status: DC

## 2024-05-09 NOTE — Assessment & Plan Note (Signed)
 Reports resolution of right shoulder pain, monitor.

## 2024-05-09 NOTE — Patient Instructions (Signed)
 VISIT SUMMARY:  Today, we reviewed your medications and made some adjustments to better manage your blood pressure. We also discussed your cholesterol, neuropathy, and smoking cessation efforts. Additionally, we planned for your upcoming colonoscopy and vaccinations.  YOUR PLAN:  HYPERTENSION: Your blood pressure has been slightly low at home, but you haven't had any symptoms. -Reduce amlodipine  to 2.5 mg daily. You will receive a prescription for 2.5 mg tablets. -Continue taking losartan  and hydrochlorothiazide  as currently prescribed.  HYPERLIPIDEMIA: Your cholesterol levels are well-controlled with your current dose of Lipitor. -Continue taking Lipitor at your current dose.  NEUROPATHY: Your neuropathy pain in your feet is well-managed with gabapentin . -Continue taking gabapentin  at your current dose.  ROTATOR CUFF INJURY: Your previous treatment for your rotator cuff injury has been effective. -Continue with your regular exercise routine.  COLORECTAL CANCER SCREENING: You are due for a follow-up colonoscopy because of your history of polyps. -You will be referred to a GI specialist for your colonoscopy.  SMOKING CESSATION: You are currently smoking two cigarettes per day and are working towards quitting. -Continue to work towards quitting smoking completely. Avoid vaping.  GENERAL HEALTH MAINTENANCE: You need to receive your shingles vaccine and plan for your flu shot. -A shingles vaccine order will be sent to the pharmacy downstairs. -Plan to get your flu shot in September during your follow-up visit.  FOLLOW-UP: We need to reassess your blood pressure and administer your flu shot in September. -Schedule a follow-up appointment in September to check your blood pressure and administer your flu shot.

## 2024-05-09 NOTE — Progress Notes (Signed)
 Subjective:     Patient ID: Janice Meadows, female    DOB: 1951/07/04, 73 y.o.   MRN: [id number]  Chief Complaint  Patient presents with   Hypertension    Here for follow up    Hypertension    Discussed the use of AI scribe software for clinical note transcription with the patient, who gave verbal consent to proceed.  History of Present Illness   Janice Meadows is a 73 year old female with hypertension who presents for a follow-up on her medications.  Her blood pressure is sometimes low at home without associated dizziness. She is on amlodipine  5 mg, losartan , and hydrochlorothiazide . She smokes about two cigarettes a day and is working towards quitting. Gabapentin  is taken at bedtime for neuropathy pain in her feet and is effective. Her right rotator cuff is no longer problematic after treatment with steroid injection. She takes Lipitor for cholesterol, last checked in January. She is due for a follow-up colonoscopy due to previous polyps. She has not received a shingles shot and inquires about her flu shot.     Health Maintenance Due  Topic Date Due   Zoster Vaccines- Shingrix  (1 of 2) Never done   COVID-19 Vaccine (6 - 2024-25 season) 06/27/2023   Colonoscopy  04/07/2024    Past Medical History:  Diagnosis Date   Arthritis    back and knees   Cataract    High cholesterol    Hypertension    Neuromuscular disorder (HCC)    neuropathy   Neuropathy     Past Surgical History:  Procedure Laterality Date   ABDOMINAL HYSTERECTOMY     BREAST CYST EXCISION Bilateral    TOOTH EXTRACTION  04/28/2017    Family History  Problem Relation Age of Onset   Hypertension Mother    Hypertension Father    Cancer Sister 24       lymphoma    Hypertension Sister    Kidney disease Daughter    Stroke Daughter    Hypertension Sister    Hypertension Sister    Hypertension Brother    Hypertension Brother    Gout Brother    Hypertension Brother    Hypertension Brother    Colon  cancer Neg Hx    Colon polyps Neg Hx    Esophageal cancer Neg Hx    Rectal cancer Neg Hx    Stomach cancer Neg Hx     Social History   Socioeconomic History   Marital status: Single    Spouse name: Not on file   Number of children: Not on file   Years of education: Not on file   Highest education level: Not on file  Occupational History   Not on file  Tobacco Use   Smoking status: Every Day    Current packs/day: 0.50    Average packs/day: 0.5 packs/day for 22.0 years (11.0 ttl pk-yrs)    Types: Cigarettes   Smokeless tobacco: Never   Tobacco comments:    5 cigarettes daily  Vaping Use   Vaping status: Never Used  Substance and Sexual Activity   Alcohol use: Yes    Alcohol/week: 0.0 standard drinks of alcohol    Comment: occasional   Drug use: No   Sexual activity: Never  Other Topics Concern   Not on file  Social History Narrative   Works in a [employer]   Single   7 children- one deceased shortly after birth   Lives alone but frequently has family visit  Has 7 grandchildren   Enjoys walking, playing grand kids, park   Dog- outdoor dog.     Social Drivers of Health   Financial Resource Strain: Medium Risk (07/21/2023)   Overall Financial Resource Strain (CARDIA)    Difficulty of Paying Living Expenses: Somewhat hard  Food Insecurity: No Food Insecurity (07/21/2023)   Hunger Vital Sign    Worried About Running Out of Food in the Last Year: Never true    Ran Out of Food in the Last Year: Never true  Transportation Needs: No Transportation Needs (07/21/2023)   PRAPARE - Administrator, Civil Service (Medical): No    Lack of Transportation (Non-Medical): No  Physical Activity: Sufficiently Active (07/21/2023)   Exercise Vital Sign    Days of Exercise per Week: 3 days    Minutes of Exercise per Session: 60 min  Stress: No Stress Concern Present (07/21/2023)   [employer] of Occupational Health - Occupational Stress Questionnaire    Feeling  of Stress : Only a little  Social Connections: Moderately Isolated (07/21/2023)   Social Connection and Isolation Panel    Frequency of Communication with Friends and Family: More than three times a week    Frequency of Social Gatherings with Friends and Family: More than three times a week    Attends Religious Services: More than 4 times per year    Active Member of [employer] or Organizations: No    Attends Banker Meetings: Never    Marital Status: Divorced  Catering manager Violence: Not At Risk (07/21/2023)   Humiliation, Afraid, Rape, and Kick questionnaire    Fear of Current or Ex-Partner: No    Emotionally Abused: No    Physically Abused: No    Sexually Abused: No    Outpatient Medications Prior to Visit  Medication Sig Dispense Refill   Calcium  Carbonate-Vitamin D  600-400 MG-UNIT tablet Take 1 tablet by mouth 2 (two) times daily.     loratadine  (CLARITIN ) 10 MG tablet Take 1 tablet (10 mg total) by mouth daily. 90 tablet 4   Multiple Vitamin (MULTIVITAMIN) tablet Take 1 tablet by mouth daily. One-a-day womens     potassium chloride  SA (KLOR-CON  M) 20 MEQ tablet Take 1 tablet (20 mEq total) by mouth daily. 90 tablet 1   amLODipine  (NORVASC ) 5 MG tablet Take 1 tablet (5 mg total) by mouth daily. 90 tablet 1   atorvastatin  (LIPITOR) 20 MG tablet TAKE 1 TABLET(20 MG) BY MOUTH DAILY 90 tablet 1   gabapentin  (NEURONTIN ) 300 MG capsule Take 1 capsule (300 mg total) by mouth at bedtime. 90 capsule 1   hydrochlorothiazide  (HYDRODIURIL ) 25 MG tablet Take 1 tablet (25 mg total) by mouth daily. 90 tablet 1   losartan  (COZAAR ) 100 MG tablet Take 1 tablet (100 mg total) by mouth daily. 90 tablet 0   Zoster Vaccine Adjuvanted (SHINGRIX ) injection 0.5mg  IM now, repeat in 2-6 months (Patient not taking: Reported on 05/09/2024) 0.5 mL 1   No facility-administered medications prior to visit.    No Known Allergies  ROS    See HPI Objective:    Physical Exam Constitutional:       General: She is not in acute distress.    Appearance: Normal appearance. She is well-developed.  HENT:     Head: Normocephalic and atraumatic.     Right Ear: External ear normal.     Left Ear: External ear normal.  Eyes:     General: No scleral icterus. Neck:  Thyroid : No thyromegaly.  Cardiovascular:     Rate and Rhythm: Normal rate and regular rhythm.     Heart sounds: Normal heart sounds. No murmur heard. Pulmonary:     Effort: Pulmonary effort is normal. No respiratory distress.     Breath sounds: Normal breath sounds. No wheezing.  Musculoskeletal:     Cervical back: Neck supple.  Skin:    General: Skin is warm and dry.  Neurological:     Mental Status: She is alert and oriented to person, place, and time.  Psychiatric:        Mood and Affect: Mood normal.        Behavior: Behavior normal.        Thought Content: Thought content normal.        Judgment: Judgment normal.      BP 93/60 (BP Location: Right Arm, Patient Position: Sitting, Cuff Size: Normal)   Pulse 78   Temp 98.7 F (37.1 C) (Oral)   Resp 16   Ht 5' 1 (1.549 m)   Wt 184 lb (83.5 kg)   SpO2 100%   BMI 34.77 kg/m  Wt Readings from Last 3 Encounters:  05/09/24 184 lb (83.5 kg)  11/10/23 186 lb (84.4 kg)  08/10/23 187 lb (84.8 kg)       Assessment & Plan:   Problem List Items Addressed This Visit       Unprioritized   Tobacco abuse   Encouraged complete cessation.        RESOLVED: Rotator cuff tendinitis, right   Reports resolution of right shoulder pain, monitor.        Neuropathy   Stable with HS gabapentin .  Continue same.      Relevant Medications   gabapentin  (NEURONTIN ) 300 MG capsule   Hyperlipidemia   Lab Results  Component Value Date   CHOL 162 11/10/2023   HDL 63.20 11/10/2023   LDLCALC 85 11/10/2023   TRIG 70.0 11/10/2023   CHOLHDL 3 11/10/2023   Stable on lipitor, continue same.       Relevant Medications   amLODipine  (NORVASC ) 2.5 MG tablet    hydrochlorothiazide  (HYDRODIURIL ) 25 MG tablet   atorvastatin  (LIPITOR) 20 MG tablet   losartan  (COZAAR ) 100 MG tablet   Essential hypertension - Primary   BP Readings from Last 3 Encounters:  05/09/24 93/60  11/10/23 111/65  08/10/23 122/67   Decrease amlodipine  to 2.5mg  as bp a bit soft.  Continue current dose of losartan  and hydrochlorothiazide .       Relevant Medications   amLODipine  (NORVASC ) 2.5 MG tablet   hydrochlorothiazide  (HYDRODIURIL ) 25 MG tablet   atorvastatin  (LIPITOR) 20 MG tablet   losartan  (COZAAR ) 100 MG tablet   Other Relevant Orders   Basic Metabolic Panel (BMET)   Other Visit Diagnoses       Screening for colon cancer       Relevant Orders   Ambulatory referral to Gastroenterology     History of chicken pox       Relevant Medications   Zoster Vaccine Adjuvanted (SHINGRIX ) injection       I have discontinued Bettie Godsey's amLODipine . I have also changed her atorvastatin . Additionally, I am having her start on amLODipine . Lastly, I am having her maintain her multivitamin, Calcium  Carbonate-Vitamin D , loratadine , potassium chloride  SA, Shingrix , hydrochlorothiazide , losartan , and gabapentin .  Meds ordered this encounter  Medications   amLODipine  (NORVASC ) 2.5 MG tablet    Sig: Take 1 tablet (2.5 mg total) by mouth daily.  Dispense:  90 tablet    Refill:  1    Supervising Provider:   DOMENICA BLACKBIRD A [[id number]]   Zoster Vaccine Adjuvanted (SHINGRIX ) injection    Sig: 0.5mg  IM now, repeat in 2-6 months    Dispense:  0.5 mL    Refill:  1    Supervising Provider:   DOMENICA BLACKBIRD A [[id number]]   hydrochlorothiazide  (HYDRODIURIL ) 25 MG tablet    Sig: Take 1 tablet (25 mg total) by mouth daily.    Dispense:  90 tablet    Refill:  1    Supervising Provider:   DOMENICA BLACKBIRD A [[id number]]   atorvastatin  (LIPITOR) 20 MG tablet    Sig: Take 1 tablet (20 mg total) by mouth daily.    Dispense:  90 tablet    Refill:  1    Supervising Provider:   DOMENICA BLACKBIRD A [[id number]]    losartan  (COZAAR ) 100 MG tablet    Sig: Take 1 tablet (100 mg total) by mouth daily.    Dispense:  90 tablet    Refill:  1    Supervising Provider:   DOMENICA BLACKBIRD A [[id number]]   gabapentin  (NEURONTIN ) 300 MG capsule    Sig: Take 1 capsule (300 mg total) by mouth at bedtime.    Dispense:  90 capsule    Refill:  1    Supervising Provider:   DOMENICA BLACKBIRD A [[id number]]

## 2024-05-09 NOTE — Assessment & Plan Note (Signed)
 Stable with HS gabapentin .  Continue same.

## 2024-05-09 NOTE — Assessment & Plan Note (Signed)
 Lab Results  Component Value Date   CHOL 162 11/10/2023   HDL 63.20 11/10/2023   LDLCALC 85 11/10/2023   TRIG 70.0 11/10/2023   CHOLHDL 3 11/10/2023   Stable on lipitor, continue same.

## 2024-05-09 NOTE — Assessment & Plan Note (Signed)
 BP Readings from Last 3 Encounters:  05/09/24 93/60  11/10/23 111/65  08/10/23 122/67   Decrease amlodipine  to 2.5mg  as bp a bit soft.  Continue current dose of losartan  and hydrochlorothiazide .

## 2024-05-09 NOTE — Assessment & Plan Note (Signed)
Encouraged complete cessation. 

## 2024-05-22 ENCOUNTER — Telehealth: Payer: Self-pay

## 2024-05-22 NOTE — Telephone Encounter (Signed)
 Patient was identified as falling into the [place] Measure - Diabetes.   Patient was: Attribution and/or data issue.  Validation/Investigation needed.  Explanation:  Patient has NO diabetes diagnosis.

## 2024-06-16 ENCOUNTER — Other Ambulatory Visit (HOSPITAL_BASED_OUTPATIENT_CLINIC_OR_DEPARTMENT_OTHER): Payer: Self-pay | Admitting: Family

## 2024-06-16 ENCOUNTER — Encounter: Payer: Self-pay | Admitting: Gastroenterology

## 2024-06-16 DIAGNOSIS — Z1231 Encounter for screening mammogram for malignant neoplasm of breast: Secondary | ICD-10-CM

## 2024-06-22 ENCOUNTER — Other Ambulatory Visit: Payer: Self-pay | Admitting: Family

## 2024-06-22 DIAGNOSIS — I10 Essential (primary) hypertension: Secondary | ICD-10-CM

## 2024-06-22 MED ORDER — AMLODIPINE BESYLATE 2.5 MG PO TABS: 2.5000 mg | ORAL_TABLET | Freq: Every day | ORAL | 1 refills | Status: DC

## 2024-06-23 ENCOUNTER — Telehealth: Payer: Self-pay | Admitting: *Deleted

## 2024-06-23 NOTE — Telephone Encounter (Signed)
 Attempted to reach patient.  Advised on VM  that after her last colonoscopy in 2022 Dr Legrand' advise was no more routine colonoscopies based on current age guidelines and her absence of polyps with that exam. Encouraged patient to call us  back if she had any questions or concerns she would like to discuss. Sent message through [employer] and mailed letter .

## 2024-07-04 ENCOUNTER — Ambulatory Visit (HOSPITAL_BASED_OUTPATIENT_CLINIC_OR_DEPARTMENT_OTHER)

## 2024-07-06 ENCOUNTER — Ambulatory Visit (HOSPITAL_BASED_OUTPATIENT_CLINIC_OR_DEPARTMENT_OTHER)
Admission: RE | Admit: 2024-07-06 | Discharge: 2024-07-06 | Disposition: A | Discharge status: Home / Self Care | Source: Ambulatory Visit | Attending: Family | Admitting: Family

## 2024-07-06 ENCOUNTER — Encounter (HOSPITAL_BASED_OUTPATIENT_CLINIC_OR_DEPARTMENT_OTHER): Payer: Self-pay

## 2024-07-06 DIAGNOSIS — Z1231 Encounter for screening mammogram for malignant neoplasm of breast: Secondary | ICD-10-CM | POA: Diagnosis not present

## 2024-07-11 ENCOUNTER — Telehealth: Payer: Self-pay | Admitting: Family

## 2024-07-11 NOTE — Telephone Encounter (Signed)
 Copied from CRM [id number]. Topic: Medicare AWV >> Jul 11, 2024 10:43 AM Nathanel DEL wrote: Reason for CRM: Called LVM 07/11/2024 to schedule AWV. Please schedule office or virtual visits.  Nathanel Paschal; Care Guide Ambulatory Clinical Support [place] [facility] Direct Dial: [phone]

## 2024-07-13 ENCOUNTER — Encounter

## 2024-07-18 ENCOUNTER — Ambulatory Visit: Admitting: Family

## 2024-07-18 VITALS — BP 95/52 | HR 67 | Temp 98.6°F | Resp 16 | Ht 61.0 in | Wt 183.0 lb

## 2024-07-18 DIAGNOSIS — E785 Hyperlipidemia, unspecified: Secondary | ICD-10-CM

## 2024-07-18 DIAGNOSIS — M159 Polyosteoarthritis, unspecified: Secondary | ICD-10-CM | POA: Diagnosis not present

## 2024-07-18 DIAGNOSIS — Z720 Tobacco use: Secondary | ICD-10-CM | POA: Diagnosis not present

## 2024-07-18 DIAGNOSIS — G629 Polyneuropathy, unspecified: Secondary | ICD-10-CM | POA: Diagnosis not present

## 2024-07-18 DIAGNOSIS — Z23 Encounter for immunization: Secondary | ICD-10-CM | POA: Diagnosis not present

## 2024-07-18 DIAGNOSIS — I10 Essential (primary) hypertension: Secondary | ICD-10-CM | POA: Diagnosis not present

## 2024-07-18 NOTE — Assessment & Plan Note (Signed)
 Reports gabapentin  HS remains helpful.

## 2024-07-18 NOTE — Assessment & Plan Note (Signed)
 Mainly in her hands- uses tylenol  with good relief.

## 2024-07-18 NOTE — Progress Notes (Signed)
 Subjective:     Patient ID: Janice Meadows, female    DOB: 11-12-1950, 73 y.o.   MRN: [id number]  No chief complaint on file.   HPI  Discussed the use of AI scribe software for clinical note transcription with the patient, who gave verbal consent to proceed.  History of Present Illness  Janice Meadows is a 73 year old female with hypertension who presents for a follow-up visit.  She is on amlodipine  2.5 mg, losartan  100 mg, and hydrochlorothiazide  25 mg for blood pressure management. Her blood pressure readings have been low, with a recent reading of 95 mmHg. She experiences nausea while sitting and watching TV but no dizziness.  She takes atorvastatin  for cholesterol management, which was checked in January and is well-controlled.  She smokes occasionally, taking about two weeks to finish a pack, with some days not smoking at all.  She takes gabapentin  at bedtime for nerve pain in her feet, which is helpful. She experiences arthritis pain in her hands and uses Tylenol  effectively for relief.  She had a colonoscopy scheduled for October 2nd. She received one shingles shot over the summer and her flu shot today. Her kidney function was checked two months ago and is good.     Health Maintenance Due  Topic Date Due   COVID-19 Vaccine (6 - 2025-26 season) 06/26/2024   Zoster Vaccines- Shingrix  (2 of 2) 07/04/2024   Medicare Annual Wellness (AWV)  07/20/2024    Past Medical History:  Diagnosis Date   Arthritis    back and knees   Cataract    High cholesterol    Hypertension    Neuromuscular disorder (HCC)    neuropathy   Neuropathy     Past Surgical History:  Procedure Laterality Date   ABDOMINAL HYSTERECTOMY     BREAST CYST EXCISION Bilateral    TOOTH EXTRACTION  04/28/2017    Family History  Problem Relation Age of Onset   Hypertension Mother    Hypertension Father    Cancer Sister 37       lymphoma    Hypertension Sister    Kidney disease Daughter    Stroke  Daughter    Hypertension Sister    Hypertension Sister    Hypertension Brother    Hypertension Brother    Gout Brother    Hypertension Brother    Hypertension Brother    Colon cancer Neg Hx    Colon polyps Neg Hx    Esophageal cancer Neg Hx    Rectal cancer Neg Hx    Stomach cancer Neg Hx     Social History   Socioeconomic History   Marital status: Single    Spouse name: Not on file   Number of children: Not on file   Years of education: Not on file   Highest education level: Not on file  Occupational History   Not on file  Tobacco Use   Smoking status: Every Day    Current packs/day: 0.50    Average packs/day: 0.5 packs/day for 22.0 years (11.0 ttl pk-yrs)    Types: Cigarettes   Smokeless tobacco: Never   Tobacco comments:    5 cigarettes daily  Vaping Use   Vaping status: Never Used  Substance and Sexual Activity   Alcohol use: Yes    Alcohol/week: 0.0 standard drinks of alcohol    Comment: occasional   Drug use: No   Sexual activity: Never  Other Topics Concern   Not on file  Social History  Narrative   Works in a [employer]   Single   7 children- one deceased shortly after birth   Lives alone but frequently has family visit   Has 7 grandchildren   Enjoys walking, playing grand kids, park   Dog- outdoor dog.     Social Drivers of Health   Financial Resource Strain: Medium Risk (07/21/2023)   Overall Financial Resource Strain (CARDIA)    Difficulty of Paying Living Expenses: Somewhat hard  Food Insecurity: No Food Insecurity (07/21/2023)   Hunger Vital Sign    Worried About Running Out of Food in the Last Year: Never true    Ran Out of Food in the Last Year: Never true  Transportation Needs: No Transportation Needs (07/21/2023)   PRAPARE - Administrator, Civil Service (Medical): No    Lack of Transportation (Non-Medical): No  Physical Activity: Sufficiently Active (07/21/2023)   Exercise Vital Sign    Days of Exercise per Week: 3 days     Minutes of Exercise per Session: 60 min  Stress: No Stress Concern Present (07/21/2023)   [employer] of Occupational Health - Occupational Stress Questionnaire    Feeling of Stress : Only a little  Social Connections: Moderately Isolated (07/21/2023)   Social Connection and Isolation Panel    Frequency of Communication with Friends and Family: More than three times a week    Frequency of Social Gatherings with Friends and Family: More than three times a week    Attends Religious Services: More than 4 times per year    Active Member of [employer] or Organizations: No    Attends Banker Meetings: Never    Marital Status: Divorced  Catering manager Violence: Not At Risk (07/21/2023)   Humiliation, Afraid, Rape, and Kick questionnaire    Fear of Current or Ex-Partner: No    Emotionally Abused: No    Physically Abused: No    Sexually Abused: No    Outpatient Medications Prior to Visit  Medication Sig Dispense Refill   atorvastatin  (LIPITOR) 20 MG tablet Take 1 tablet (20 mg total) by mouth daily. 90 tablet 1   Calcium  Carbonate-Vitamin D  600-400 MG-UNIT tablet Take 1 tablet by mouth 2 (two) times daily.     gabapentin  (NEURONTIN ) 300 MG capsule Take 1 capsule (300 mg total) by mouth at bedtime. 90 capsule 1   hydrochlorothiazide  (HYDRODIURIL ) 25 MG tablet Take 1 tablet (25 mg total) by mouth daily. 90 tablet 1   loratadine  (CLARITIN ) 10 MG tablet Take 1 tablet (10 mg total) by mouth daily. 90 tablet 4   losartan  (COZAAR ) 100 MG tablet Take 1 tablet (100 mg total) by mouth daily. 90 tablet 1   Multiple Vitamin (MULTIVITAMIN) tablet Take 1 tablet by mouth daily. One-a-day womens     potassium chloride  SA (KLOR-CON  M) 20 MEQ tablet TAKE 1 TABLET(20 MEQ) BY MOUTH DAILY 90 tablet 1   amLODipine  (NORVASC ) 2.5 MG tablet Take 1 tablet (2.5 mg total) by mouth daily. 90 tablet 1   No facility-administered medications prior to visit.    No Known Allergies  ROS See HPI     Objective:    Physical Exam Constitutional:      Appearance: She is well-developed.  Cardiovascular:     Rate and Rhythm: Normal rate and regular rhythm.     Heart sounds: Normal heart sounds. No murmur heard. Pulmonary:     Effort: Pulmonary effort is normal. No respiratory distress.     Breath sounds:  Normal breath sounds. No wheezing.  Psychiatric:        Behavior: Behavior normal.        Thought Content: Thought content normal.        Judgment: Judgment normal.      BP (!) 95/52 (BP Location: Right Arm, Patient Position: Sitting, Cuff Size: Normal)   Pulse 67   Temp 98.6 F (37 C) (Oral)   Resp 16   Ht 5' 1 (1.549 m)   Wt 183 lb (83 kg)   SpO2 99%   BMI 34.58 kg/m  Wt Readings from Last 3 Encounters:  07/18/24 183 lb (83 kg)  05/09/24 184 lb (83.5 kg)  11/10/23 186 lb (84.4 kg)       Assessment & Plan:   Problem List Items Addressed This Visit       Unprioritized   Tobacco abuse   Only smoking some days.  Encouraged patient to stop completely.       Neuropathy   Reports gabapentin  HS remains helpful.       Hyperlipidemia   Lab Results  Component Value Date   CHOL 162 11/10/2023   HDL 63.20 11/10/2023   LDLCALC 85 11/10/2023   TRIG 70.0 11/10/2023   CHOLHDL 3 11/10/2023   Stable on atorvastatin , continue same.       Generalized OA   Mainly in her hands- uses tylenol  with good relief.        Essential hypertension   On amlodipine , hydrochlorothiazide , losartan .  Appears overtreated.  Will d/c amlodipine .  BP Readings from Last 3 Encounters:  07/18/24 (!) 95/52  05/09/24 93/60  11/10/23 111/65         Other Visit Diagnoses       Needs flu shot    -  Primary   Relevant Orders   Flu vaccine HIGH DOSE PF(Fluzone Trivalent) (Completed)       I have discontinued Anner Bruning's amLODipine . I am also having her maintain her multivitamin, Calcium  Carbonate-Vitamin D , loratadine , hydrochlorothiazide , atorvastatin , losartan , gabapentin ,  and potassium chloride  SA.  No orders of the defined types were placed in this encounter.

## 2024-07-18 NOTE — Patient Instructions (Signed)
 VISIT SUMMARY:  Today, we reviewed your blood pressure management, nerve pain, arthritis, and smoking habits. We made some adjustments to your medications and discussed monitoring your blood pressure at home.  YOUR PLAN:  ESSENTIAL HYPERTENSION: Your blood pressure has been slightly low. -Stop taking amlodipine  to prevent low blood pressure. -Monitor your blood pressure at home and send us  the readings after one week without amlodipine . -Make sure your systolic blood pressure stays between 100 and 120 mmHg.  PERIPHERAL NEUROPATHY: You have nerve pain in your feet. -Continue taking gabapentin  at bedtime as it is helping with the pain.  OSTEOARTHRITIS OF HANDS: You have arthritis pain in your hands. -Continue using Tylenol  for pain relief as it is effective.  TOBACCO USE: You smoke occasionally, about one pack every two weeks. -Try to quit smoking completely since you are already close to stopping.

## 2024-07-18 NOTE — Assessment & Plan Note (Signed)
 Lab Results  Component Value Date   CHOL 162 11/10/2023   HDL 63.20 11/10/2023   LDLCALC 85 11/10/2023   TRIG 70.0 11/10/2023   CHOLHDL 3 11/10/2023   Stable on atorvastatin , continue same.

## 2024-07-18 NOTE — Assessment & Plan Note (Signed)
 Only smoking some days.  Encouraged patient to stop completely.

## 2024-07-18 NOTE — Assessment & Plan Note (Signed)
 On amlodipine , hydrochlorothiazide , losartan .  Appears overtreated.  Will d/c amlodipine .  BP Readings from Last 3 Encounters:  07/18/24 (!) 95/52  05/09/24 93/60  11/10/23 111/65

## 2024-07-27 ENCOUNTER — Encounter: Admitting: Gastroenterology

## 2024-08-02 ENCOUNTER — Other Ambulatory Visit: Payer: Self-pay | Admitting: Family

## 2024-08-02 DIAGNOSIS — I10 Essential (primary) hypertension: Secondary | ICD-10-CM

## 2024-09-08 ENCOUNTER — Other Ambulatory Visit: Payer: Self-pay | Admitting: Family

## 2024-09-08 DIAGNOSIS — G629 Polyneuropathy, unspecified: Secondary | ICD-10-CM

## 2024-11-01 ENCOUNTER — Other Ambulatory Visit: Payer: Self-pay | Admitting: Family

## 2024-11-01 DIAGNOSIS — I10 Essential (primary) hypertension: Secondary | ICD-10-CM

## 2024-11-17 ENCOUNTER — Ambulatory Visit: Admitting: Family

## 2024-11-17 ENCOUNTER — Ambulatory Visit: Payer: Self-pay | Admitting: Family

## 2024-11-17 VITALS — BP 130/67 | HR 69 | Temp 98.2°F | Resp 16 | Ht 61.0 in | Wt 185.0 lb

## 2024-11-17 DIAGNOSIS — I10 Essential (primary) hypertension: Secondary | ICD-10-CM | POA: Diagnosis not present

## 2024-11-17 DIAGNOSIS — E785 Hyperlipidemia, unspecified: Secondary | ICD-10-CM

## 2024-11-17 DIAGNOSIS — G629 Polyneuropathy, unspecified: Secondary | ICD-10-CM

## 2024-11-17 LAB — LIPID PANEL
Cholesterol: 169 mg/dL (ref 28–200)
HDL: 64.3 mg/dL
LDL Cholesterol: 91 mg/dL (ref 10–99)
NonHDL: 104.2
Total CHOL/HDL Ratio: 3
Triglycerides: 64 mg/dL (ref 10.0–149.0)
VLDL: 12.8 mg/dL (ref 0.0–40.0)

## 2024-11-17 LAB — COMPREHENSIVE METABOLIC PANEL WITH GFR
ALT: 11 U/L (ref 3–35)
AST: 23 U/L (ref 5–37)
Albumin: 3.3 g/dL — ABNORMAL LOW (ref 3.5–5.2)
Alkaline Phosphatase: 113 U/L (ref 39–117)
BUN: 12 mg/dL (ref 6–23)
CO2: 30 meq/L (ref 19–32)
Calcium: 9 mg/dL (ref 8.4–10.5)
Chloride: 105 meq/L (ref 96–112)
Creatinine, Ser: 0.84 mg/dL (ref 0.40–1.20)
GFR: 69.12 mL/min
Glucose, Bld: 83 mg/dL (ref 70–99)
Potassium: 4.2 meq/L (ref 3.5–5.1)
Sodium: 141 meq/L (ref 135–145)
Total Bilirubin: 0.3 mg/dL (ref 0.2–1.2)
Total Protein: 6.6 g/dL (ref 6.0–8.3)

## 2024-11-17 MED ORDER — ATORVASTATIN CALCIUM 20 MG PO TABS: 20.0000 mg | ORAL_TABLET | Freq: Every day | ORAL | 1 refills | Status: AC

## 2024-11-17 MED ORDER — LOSARTAN POTASSIUM 100 MG PO TABS: 100.0000 mg | ORAL_TABLET | Freq: Every day | ORAL | 1 refills | Status: AC

## 2024-11-17 MED ORDER — POTASSIUM CHLORIDE CRYS ER 20 MEQ PO TBCR: 20.0000 meq | EXTENDED_RELEASE_TABLET | Freq: Every day | ORAL | 1 refills | Status: AC

## 2024-11-17 NOTE — Assessment & Plan Note (Signed)
BP stable, continue losartan.  

## 2024-11-17 NOTE — Patient Instructions (Signed)
" °  VISIT SUMMARY: During your visit, we reviewed your blood pressure management, cholesterol levels, neuropathy, and general health maintenance. Your blood pressure is well-controlled, and you are managing your neuropathy effectively. We also discussed your cholesterol management and the need for a shingles vaccine.  YOUR PLAN: -ESSENTIAL HYPERTENSION: Essential hypertension is high blood pressure with no identifiable cause. Your blood pressure is well-controlled with your current medication, Losartan . Please continue taking Losartan  and monitor your blood pressure twice a week.  -HYPERLIPIDEMIA: Hyperlipidemia is having high levels of cholesterol in the blood. Your cholesterol levels are due for re-evaluation, so we have ordered a cholesterol panel. Continue taking Atorvastatin  20 mg daily.  -PERIPHERAL NEUROPATHY: Peripheral neuropathy is a condition that results in numbness, tingling, or pain, usually in the hands and feet. Your neuropathy pain is well-managed with Gabapentin . Continue taking Gabapentin  300 mg at bedtime.  -GENERAL HEALTH MAINTENANCE: For your general health, you need to get the shingles vaccine. Due to insurance, you should obtain the shingles vaccine at the pharmacy.  INSTRUCTIONS: Please follow up with the cholesterol panel as ordered. Continue monitoring your blood pressure twice a week and taking your medications as prescribed. Obtain the shingles vaccine at the pharmacy.    Contains text generated by [employer].   "

## 2024-11-17 NOTE — Progress Notes (Signed)
" ° °  Established Patient Office Visit  Subjective   Patient ID: Janice Meadows, female    DOB: 02/19/1951  Age: 74 y.o. MRN: [id number]  Chief Complaint  Patient presents with   Hypertension    Here for follow up   Hypertension    Patient in office today for blood pressure follow up. BP at goal. Patient reports compliance with current medication regime with no complaints. She takes her blood pressure 2x a week and all BP's have been <140/90.  Review of Systems  Constitutional: Negative.   Respiratory: Negative.    Cardiovascular: Negative.       Objective:     BP 130/67 (BP Location: Right Arm, Patient Position: Sitting, Cuff Size: Normal)   Pulse 69   Temp 98.2 F (36.8 C) (Oral)   Resp 16   Ht 5' 1 (1.549 m)   Wt 185 lb (83.9 kg)   SpO2 100%   BMI 34.96 kg/m  BP Readings from Last 3 Encounters:  11/17/24 130/67  07/18/24 (!) 95/52  05/09/24 93/60   Physical Exam Constitutional:      Appearance: Normal appearance.  HENT:     Head: Normocephalic.  Cardiovascular:     Rate and Rhythm: Normal rate and regular rhythm.     Heart sounds: Normal heart sounds.  Pulmonary:     Effort: Pulmonary effort is normal.     Breath sounds: Normal breath sounds.  Neurological:     Mental Status: She is alert and oriented to person, place, and time.      Assessment & Plan:  Hypertension -stable on losartan , atorvastatin , and potassium  Hyperlipidemia  -lipid panel -cmet  -continue medication  Neuropathy  -stable on gabapentin   Preventative Care -shingles vaccine, patient will get at pharmacy   Follow up in 6 months   Levon Budd, FNP-Student  "

## 2024-11-17 NOTE — Assessment & Plan Note (Signed)
 Lab Results  Component Value Date   CHOL 162 11/10/2023   HDL 63.20 11/10/2023   LDLCALC 85 11/10/2023   TRIG 70.0 11/10/2023   CHOLHDL 3 11/10/2023  Continues lipitor 20mg , update lipid panel.

## 2024-11-17 NOTE — Progress Notes (Signed)
 "  Subjective:     Patient ID: Janice Meadows, female    DOB: [birth year], 74 y.o.   MRN: [id number]  Chief Complaint  Patient presents with   Hypertension    Here for follow up    Hypertension    Discussed the use of AI scribe software for clinical note transcription with the patient, who gave verbal consent to proceed.  History of Present Illness Janice Meadows is a 73 year old female with hypertension who presents for a blood pressure follow-up.  She has been taking her blood pressure medications daily and checks her blood pressure twice a week. Previously, she was checking it daily but reduced the frequency as her readings have consistently been below 140/90 mmHg over the past month.  She continues to take gabapentin  for neuropathy. She takes it at bedtime as the neuropathy sometimes bothers her at night. Currently, she experiences no pain from neuropathy.  She takes potassium daily, one tablet once a day. She is also on atorvastatin  for cholesterol management, which was last checked a year ago and was reported to be very good at that time.  She smokes one to three cigarettes per week and is actively trying to quit. She feels positive about her progress in reducing smoking.  She has already received her flu vaccine and is interested in getting the shingles vaccine.      Health Maintenance Due  Topic Date Due   COVID-19 Vaccine (6 - 2025-26 season) 06/26/2024   Zoster Vaccines- Shingrix  (2 of 2) 07/04/2024   Medicare Annual Wellness (AWV)  07/20/2024    Past Medical History:  Diagnosis Date   Arthritis    back and knees   Cataract    High cholesterol    Hypertension    Neuromuscular disorder (HCC)    neuropathy   Neuropathy     Past Surgical History:  Procedure Laterality Date   ABDOMINAL HYSTERECTOMY     BREAST CYST EXCISION Bilateral    TOOTH EXTRACTION  04/28/2017    Family History  Problem Relation Age of Onset   Hypertension Mother    Hypertension  Father    Cancer Sister 30       lymphoma    Hypertension Sister    Kidney disease Daughter    Stroke Daughter    Hypertension Sister    Hypertension Sister    Hypertension Brother    Hypertension Brother    Gout Brother    Hypertension Brother    Hypertension Brother    Colon cancer Neg Hx    Colon polyps Neg Hx    Esophageal cancer Neg Hx    Rectal cancer Neg Hx    Stomach cancer Neg Hx     Social History   Socioeconomic History   Marital status: Single    Spouse name: Not on file   Number of children: Not on file   Years of education: Not on file   Highest education level: Not on file  Occupational History   Not on file  Tobacco Use   Smoking status: Every Day    Current packs/day: 0.50    Average packs/day: 0.5 packs/day for 22.0 years (11.0 ttl pk-yrs)    Types: Cigarettes   Smokeless tobacco: Never   Tobacco comments:    5 cigarettes daily  Vaping Use   Vaping status: Never Used  Substance and Sexual Activity   Alcohol use: Yes    Alcohol/week: 0.0 standard drinks of alcohol    Comment: occasional  Drug use: No   Sexual activity: Never  Other Topics Concern   Not on file  Social History Narrative   Works in a [employer]   Single   7 children- one deceased shortly after birth   Lives alone but frequently has family visit   Has 7 grandchildren   Enjoys walking, playing grand kids, park   Dog- outdoor dog.     Social Drivers of Health   Tobacco Use: High Risk (07/06/2024)   Patient History    Smoking Tobacco Use: Every Day    Smokeless Tobacco Use: Never    Passive Exposure: Not on file  Financial Resource Strain: Medium Risk (07/21/2023)   Overall Financial Resource Strain (CARDIA)    Difficulty of Paying Living Expenses: Somewhat hard  Food Insecurity: No Food Insecurity (07/21/2023)   Hunger Vital Sign    Worried About Running Out of Food in the Last Year: Never true    Ran Out of Food in the Last Year: Never true  Transportation Needs:  No Transportation Needs (07/21/2023)   PRAPARE - Administrator, Civil Service (Medical): No    Lack of Transportation (Non-Medical): No  Physical Activity: Sufficiently Active (07/21/2023)   Exercise Vital Sign    Days of Exercise per Week: 3 days    Minutes of Exercise per Session: 60 min  Stress: No Stress Concern Present (07/21/2023)   [employer] of Occupational Health - Occupational Stress Questionnaire    Feeling of Stress : Only a little  Social Connections: Moderately Isolated (07/21/2023)   Social Connection and Isolation Panel    Frequency of Communication with Friends and Family: More than three times a week    Frequency of Social Gatherings with Friends and Family: More than three times a week    Attends Religious Services: More than 4 times per year    Active Member of [employer] or Organizations: No    Attends Banker Meetings: Never    Marital Status: Divorced  Catering Manager Violence: Not At Risk (07/21/2023)   Humiliation, Afraid, Rape, and Kick questionnaire    Fear of Current or Ex-Partner: No    Emotionally Abused: No    Physically Abused: No    Sexually Abused: No  Depression (PHQ2-9): Low Risk (07/18/2024)   Depression (PHQ2-9)    PHQ-2 Score: 0  Alcohol Screen: Low Risk (07/21/2023)   Alcohol Screen    Last Alcohol Screening Score (AUDIT): 1  Housing: Low Risk (07/21/2023)   Housing    Last Housing Risk Score: 0  Utilities: Not At Risk (07/21/2023)   AHC Utilities    Threatened with loss of utilities: No  Health Literacy: Adequate Health Literacy (07/21/2023)   B1300 Health Literacy    Frequency of need for help with medical instructions: Never    Outpatient Medications Prior to Visit  Medication Sig Dispense Refill   Calcium  Carbonate-Vitamin D  600-400 MG-UNIT tablet Take 1 tablet by mouth 2 (two) times daily.     gabapentin  (NEURONTIN ) 300 MG capsule TAKE 1 CAPSULE(300 MG) BY MOUTH AT BEDTIME 90 capsule 1   hydrochlorothiazide   (HYDRODIURIL ) 25 MG tablet TAKE 1 TABLET(25 MG) BY MOUTH DAILY 90 tablet 1   loratadine  (CLARITIN ) 10 MG tablet Take 1 tablet (10 mg total) by mouth daily. 90 tablet 4   Multiple Vitamin (MULTIVITAMIN) tablet Take 1 tablet by mouth daily. One-a-day womens     atorvastatin  (LIPITOR) 20 MG tablet Take 1 tablet (20 mg total) by mouth daily.  90 tablet 1   losartan  (COZAAR ) 100 MG tablet Take 1 tablet (100 mg total) by mouth daily. 90 tablet 1   potassium chloride  SA (KLOR-CON  M) 20 MEQ tablet TAKE 1 TABLET(20 MEQ) BY MOUTH DAILY 90 tablet 1   No facility-administered medications prior to visit.    Allergies[1]  ROS    See HPI Objective:    Physical Exam Constitutional:      General: She is not in acute distress.    Appearance: Normal appearance. She is well-developed.  HENT:     Head: Normocephalic and atraumatic.     Right Ear: External ear normal.     Left Ear: External ear normal.  Eyes:     General: No scleral icterus. Neck:     Thyroid : No thyromegaly.  Cardiovascular:     Rate and Rhythm: Normal rate and regular rhythm.     Heart sounds: Normal heart sounds. No murmur heard. Pulmonary:     Effort: Pulmonary effort is normal. No respiratory distress.     Breath sounds: Normal breath sounds. No wheezing.  Musculoskeletal:     Cervical back: Neck supple.  Skin:    General: Skin is warm and dry.  Neurological:     Mental Status: She is alert and oriented to person, place, and time.  Psychiatric:        Mood and Affect: Mood normal.        Behavior: Behavior normal.        Thought Content: Thought content normal.        Judgment: Judgment normal.      BP 130/67 (BP Location: Right Arm, Patient Position: Sitting, Cuff Size: Normal)   Pulse 69   Temp 98.2 F (36.8 C) (Oral)   Resp 16   Ht 5' 1 (1.549 m)   Wt 185 lb (83.9 kg)   SpO2 100%   BMI 34.96 kg/m  Wt Readings from Last 3 Encounters:  11/17/24 185 lb (83.9 kg)  07/18/24 183 lb (83 kg)  05/09/24 184 lb  (83.5 kg)       Assessment & Plan:   Problem List Items Addressed This Visit       Unprioritized   Hyperlipidemia - Primary   Lab Results  Component Value Date   CHOL 162 11/10/2023   HDL 63.20 11/10/2023   LDLCALC 85 11/10/2023   TRIG 70.0 11/10/2023   CHOLHDL 3 11/10/2023  Continues lipitor 20mg , update lipid panel.        Relevant Medications   losartan  (COZAAR ) 100 MG tablet   atorvastatin  (LIPITOR) 20 MG tablet   Other Relevant Orders   Lipid panel   Comp Met (CMET)   Essential hypertension   BP stable, continue losartan .       Relevant Medications   losartan  (COZAAR ) 100 MG tablet   atorvastatin  (LIPITOR) 20 MG tablet   potassium chloride  SA (KLOR-CON  M) 20 MEQ tablet    I have changed Janice Meadows's potassium chloride  SA. I am also having her maintain her multivitamin, Calcium  Carbonate-Vitamin D , loratadine , gabapentin , hydrochlorothiazide , losartan , and atorvastatin .  Meds ordered this encounter  Medications   losartan  (COZAAR ) 100 MG tablet    Sig: Take 1 tablet (100 mg total) by mouth daily.    Dispense:  90 tablet    Refill:  1    Supervising Provider:   DOMENICA BLACKBIRD A [[id number]]   atorvastatin  (LIPITOR) 20 MG tablet    Sig: Take 1 tablet (20 mg total) by mouth daily.  Dispense:  90 tablet    Refill:  1    Supervising Provider:   DOMENICA BLACKBIRD A [[id number]]   potassium chloride  SA (KLOR-CON  M) 20 MEQ tablet    Sig: Take 1 tablet (20 mEq total) by mouth daily.    Dispense:  90 tablet    Refill:  1    Supervising Provider:   DOMENICA BLACKBIRD A [[id number]]      [1] No Known Allergies  "

## 2024-11-23 ENCOUNTER — Ambulatory Visit (INDEPENDENT_AMBULATORY_CARE_PROVIDER_SITE_OTHER): Admitting: *Deleted

## 2024-11-23 VITALS — BP 126/78 | Ht 61.0 in | Wt 185.0 lb

## 2024-11-23 DIAGNOSIS — Z1231 Encounter for screening mammogram for malignant neoplasm of breast: Secondary | ICD-10-CM

## 2024-11-23 DIAGNOSIS — M8580 Other specified disorders of bone density and structure, unspecified site: Secondary | ICD-10-CM

## 2024-11-23 DIAGNOSIS — Z780 Asymptomatic menopausal state: Secondary | ICD-10-CM

## 2024-11-23 DIAGNOSIS — Z0000 Encounter for general adult medical examination without abnormal findings: Secondary | ICD-10-CM

## 2024-11-23 NOTE — Progress Notes (Signed)
 "  Please attest this visit in the absence of patient primary care provider.   Chief Complaint  Patient presents with   Medicare Wellness     Subjective:   Janice Meadows is a 74 y.o. female who presents for a Medicare Annual Wellness Visit.  Visit info / Clinical Intake: Medicare Wellness Visit Type:: Subsequent Annual Wellness Visit Persons participating in visit and providing information:: patient Medicare Wellness Visit Mode:: Telephone If telephone:: video declined Since this visit was completed virtually, some vitals may be partially provided or unavailable. Missing vitals are due to the limitations of the virtual format.: Unable to obtain vitals - no equipment If Telephone or Video please confirm:: I connected with patient using audio/video enable telemedicine. I verified patient identity with two identifiers, discussed telehealth limitations, and patient agreed to proceed. Patient Location:: home Provider Location:: office Interpreter Needed?: No Pre-visit prep was completed: yes AWV questionnaire completed by patient prior to visit?: no Living arrangements:: (!) lives alone Patient's Overall Health Status Rating: very good Typical amount of pain: none Does pain affect daily life?: no Are you currently prescribed opioids?: no  Dietary Habits and Nutritional Risks How many meals a day?: 2 (eats a snack for lunch) Eats fruit and vegetables daily?: yes Most meals are obtained by: preparing own meals In the last 2 weeks, have you had any of the following?: none Diabetic:: no  Functional Status Activities of Daily Living (to include ambulation/medication): Independent Ambulation: Independent with device- listed below Home Assistive Devices/Equipment: [place] (uses for long distances due to arthritis / bursitis) Medication Administration: Independent Home Management (perform basic housework or laundry): Independent Manage your own finances?: yes Primary transportation is:  driving Concerns about vision?: no *vision screening is required for WT[facility]* (Will schedule appt when weather improves-- [facility]) Concerns about hearing?: no  Fall Screening Falls in the past year?: 0 Number of falls in past year: 0 Was there an injury with Fall?: 0 Fall Risk Category Calculator: 0 Patient Fall Risk Level: Low Fall Risk  Fall Risk Patient at Risk for Falls Due to: Orthopedic patient Fall risk Follow up: Falls evaluation completed  Home and Transportation Safety: All rugs have non-skid backing?: N/A, no rugs All stairs or steps have railings?: yes (2 steps up into the home) Grab bars in the bathtub or shower?: yes Have non-skid surface in bathtub or shower?: yes Good home lighting?: yes Regular seat belt use?: yes Hospital stays in the last year:: no  Cognitive Assessment Difficulty concentrating, remembering, or making decisions? : no Will 6CIT or Mini Cog be Completed: yes What year is it?: 0 points What month is it?: 0 points Give patient an address phrase to remember (5 components): [address] [place]  About what time is it?: 0 points Count backwards from 20 to 1: 0 points Say the months of the year in reverse: 0 points Repeat the address phrase from earlier: 0 points 6 CIT Score: 0 points  Advance Directives (For Healthcare) Does Patient Have a Medical Advance Directive?: No Would patient like information on creating a medical advance directive?: Yes (MAU/Ambulatory/Procedural Areas - Information given)  Reviewed/Updated  Reviewed/Updated: Reviewed All (Medical, Surgical, Family, Medications, Allergies, Care Teams, Patient Goals)    Allergies (verified) Patient has no known allergies.   Current Medications (verified) Outpatient Encounter Medications as of 11/23/2024  Medication Sig   atorvastatin  (LIPITOR) 20 MG tablet Take 1 tablet (20 mg total) by mouth daily.   Calcium  Carbonate-Vitamin D  600-400 MG-UNIT tablet  Take 1 tablet by mouth 2 (two) times daily.   gabapentin  (NEURONTIN ) 300 MG capsule TAKE 1 CAPSULE(300 MG) BY MOUTH AT BEDTIME   hydrochlorothiazide  (HYDRODIURIL ) 25 MG tablet TAKE 1 TABLET(25 MG) BY MOUTH DAILY   loratadine  (CLARITIN ) 10 MG tablet Take 1 tablet (10 mg total) by mouth daily.   losartan  (COZAAR ) 100 MG tablet Take 1 tablet (100 mg total) by mouth daily.   Multiple Vitamin (MULTIVITAMIN) tablet Take 1 tablet by mouth daily. One-a-day womens   potassium chloride  SA (KLOR-CON  M) 20 MEQ tablet Take 1 tablet (20 mEq total) by mouth daily.   No facility-administered encounter medications on file as of 11/23/2024.    History: Past Medical History:  Diagnosis Date   Arthritis    back and knees   Cataract    High cholesterol    Hypertension    Neuromuscular disorder (HCC)    neuropathy   Neuropathy    Past Surgical History:  Procedure Laterality Date   ABDOMINAL HYSTERECTOMY     BREAST CYST EXCISION Bilateral    TOOTH EXTRACTION  04/28/2017   Family History  Problem Relation Age of Onset   Hypertension Mother    Hypertension Father    Cancer Sister 41       lymphoma    Hypertension Sister    Kidney disease Daughter    Stroke Daughter    Hypertension Sister    Hypertension Sister    Hypertension Brother    Hypertension Brother    Gout Brother    Hypertension Brother    Hypertension Brother    Colon cancer Neg Hx    Colon polyps Neg Hx    Esophageal cancer Neg Hx    Rectal cancer Neg Hx    Stomach cancer Neg Hx    Social History   Occupational History   Not on file  Tobacco Use   Smoking status: Every Day    Current packs/day: 0.25    Average packs/day: 0.3 packs/day for 22.0 years (5.5 ttl pk-yrs)    Types: Cigarettes   Smokeless tobacco: Never   Tobacco comments:    5 cigarettes daily  Vaping Use   Vaping status: Never Used  Substance and Sexual Activity   Alcohol use: Yes    Alcohol/week: 0.0 standard drinks of alcohol    Comment: occasional    Drug use: No   Sexual activity: Never   Tobacco Counseling Ready to quit: Not Answered Counseling given: Not Answered Tobacco comments: 5 cigarettes daily  SDOH Screenings   Food Insecurity: No Food Insecurity (11/23/2024)  Housing: Low Risk (11/23/2024)  Transportation Needs: No Transportation Needs (11/23/2024)  Utilities: Not At Risk (11/23/2024)  Alcohol Screen: Low Risk (07/21/2023)  Depression (PHQ2-9): Low Risk (11/23/2024)  Financial Resource Strain: Medium Risk (07/21/2023)  Physical Activity: Sufficiently Active (11/23/2024)  Social Connections: Moderately Isolated (11/23/2024)  Stress: No Stress Concern Present (11/23/2024)  Tobacco Use: High Risk (11/23/2024)  Health Literacy: Adequate Health Literacy (07/21/2023)   See flowsheets for full screening details  Depression Screen PHQ 2 & 9 Depression Scale- Over the past 2 weeks, how often have you been bothered by any of the following problems? Little interest or pleasure in doing things: 0 Feeling down, depressed, or hopeless (PHQ Adolescent also includes...irritable): 0 PHQ-2 Total Score: 0 Trouble falling or staying asleep, or sleeping too much: 0 Feeling tired or having little energy: 0 Poor appetite or overeating (PHQ Adolescent also includes...weight loss): 0 Feeling bad about yourself - or that you  are a failure or have let yourself or your family down: 0 Trouble concentrating on things, such as reading the newspaper or watching television (PHQ Adolescent also includes...like school work): 0 Moving or speaking so slowly that other people could have noticed. Or the opposite - being so fidgety or restless that you have been moving around a lot more than usual: 0 Thoughts that you would be better off dead, or of hurting yourself in some way: 0 PHQ-9 Total Score: 0 If you checked off any problems, how difficult have these problems made it for you to do your work, take care of things at home, or get along with other people?:  Not difficult at all     Goals Addressed             This Visit's Progress    Patient Stated   On track    Drink more water     Quit Smoking   On track            Objective:    Today's Vitals   11/23/24 0945  BP: 126/78  Weight: 185 lb (83.9 kg)  Height: 5' 1 (1.549 m)   Body mass index is 34.96 kg/m.  Hearing/Vision screen No results found. Immunizations and Health Maintenance Health Maintenance  Topic Date Due   Zoster Vaccines- Shingrix  (2 of 2) 07/04/2024   COVID-19 Vaccine (6 - 2025-26 season) 11/23/2025 (Originally 06/26/2024)   DTaP/Tdap/Td (2 - Td or Tdap) 03/03/2025   Medicare Annual Wellness (AWV)  11/23/2025   Mammogram  07/06/2026   Pneumococcal Vaccine: 50+ Years  Completed   Influenza Vaccine  Completed   Bone Density Scan  Completed   Hepatitis C Screening  Completed   Meningococcal B Vaccine  Aged Out   Colonoscopy  Discontinued        Assessment/Plan:  This is a routine wellness examination for Janice Meadows.  Patient Care Team: Daryl Setter, NP as PCP - General (Internal Medicine) Nahser, Aleene PARAS, MD (Inactive) as PCP - Cardiology (Cardiology) Legrand Victory LITTIE MOULD, MD as Consulting Physician (Gastroenterology)  I have personally reviewed and noted the following in the patients chart:   Medical and social history Use of alcohol, tobacco or illicit drugs  Current medications and supplements including opioid prescriptions. Functional ability and status Nutritional status Physical activity Advanced directives List of other physicians Hospitalizations, surgeries, and ER visits in previous 12 months Vitals Screenings to include cognitive, depression, and falls Referrals and appointments  Orders Placed This Encounter  Procedures   MM 3D SCREENING MAMMOGRAM BILATERAL BREAST    Standing Status:   Future    Expected Date:   07/06/2025    Expiration Date:   11/23/2025    Reason for Exam (SYMPTOM  OR DIAGNOSIS REQUIRED):   breast cancer  screening    Preferred imaging location?:   [facility]   DG Bone Density    Standing Status:   Future    Expected Date:   11/23/2024    Expiration Date:   11/23/2025    Reason for Exam (SYMPTOM  OR DIAGNOSIS REQUIRED):   osteopenia, postmenopausal estrogen deficiency    Preferred imaging location?:   [facility]   In addition, I have reviewed and discussed with patient certain preventive protocols, quality metrics, and best practice recommendations. A written personalized care plan for preventive services as well as general preventive health recommendations were provided to patient.   Janice Meadows, CMA   11/23/2024   Return in  1 year (on 11/23/2025).  After Visit Summary: (MyChart) Due to this being a telephonic visit, the after visit summary with patients personalized plan was offered to patient via MyChart   Nurse Notes: HM Addressed: Vaccines Due: 2nd Shingles and Tetanus at pharmacy. Declines Covid vaccine Mammogram ordered DEXA ordered  "

## 2024-11-23 NOTE — Patient Instructions (Addendum)
 Janice Meadows,  Thank you for taking the time for your Medicare Wellness Visit. I appreciate your continued commitment to your health goals. Please review the care plan we discussed, and feel free to reach out if I can assist you further.  Please note that Annual Wellness Visits do not include a physical exam. Some assessments may be limited, especially if the visit was conducted virtually. If needed, we may recommend an in-person follow-up with your provider.  Goal: Continue to stop smoking and drinking more water  Ongoing Care Seeing your primary care provider every 3 to 6 months helps us  monitor your health and provide consistent, personalized care.   Eleanor Ponto, NP:  07/20/25 9am Medicare AWV:  11/26/25 9:40am, telephone  Referrals If a referral was made during today's visit and you haven't received any updates within two weeks, please contact the referred provider directly to check on the status.  Mammogram due 07/07/24 [facility]): [phone] Bone Density due now [facility]):  [phone]  Recommended Screenings: You will need to get the following vaccines at your local pharmacy: 2nd Shingles and tetanus booster.  Health Maintenance  Topic Date Due   Zoster (Shingles) Vaccine (2 of 2) 07/04/2024   Medicare Annual Wellness Visit  07/20/2024   COVID-19 Vaccine (6 - 2025-26 season) 11/23/2025*   DTaP/Tdap/Td vaccine (2 - Td or Tdap) 03/03/2025   Breast Cancer Screening  07/06/2026   Pneumococcal Vaccine for age over 30  Completed   Flu Shot  Completed   Osteoporosis screening with Bone Density Scan  Completed   Hepatitis C Screening  Completed   Meningitis B Vaccine  Aged Out   Colon Cancer Screening  Discontinued  *Topic was postponed. The date shown is not the original due date.       11/23/2024    9:49 AM  Advanced Directives  Does Patient Have a Medical Advance Directive? No  Would patient like information on creating a medical advance  directive? Yes (MAU/Ambulatory/Procedural Areas - Information given)  Once completed and notarized, you may return a copy of your Advanced Directive(s) by either of the following: Bring a copy of your health care power of attorney and living will to the office to be added to your chart at your convenience. You can mail a copy to [facility] [address] [place], [place] [address] or email to [email or web address]   Vision: Annual vision screenings are recommended for early detection of glaucoma, cataracts, and diabetic retinopathy. These exams can also reveal signs of chronic conditions such as diabetes and high blood pressure.  Dental: Annual dental screenings help detect early signs of oral cancer, gum disease, and other conditions linked to overall health, including heart disease and diabetes.  Please see the attached documents for additional preventive care recommendations.

## 2024-12-25 ENCOUNTER — Other Ambulatory Visit (HOSPITAL_BASED_OUTPATIENT_CLINIC_OR_DEPARTMENT_OTHER)

## 2025-07-20 ENCOUNTER — Ambulatory Visit: Admitting: Family

## 2025-11-26 ENCOUNTER — Ambulatory Visit
